# Patient Record
Sex: Male | Born: 1995
Health system: Southern US, Community
[De-identification: ages and names within clinical notes are randomized; demographics above are authoritative.]

## PROBLEM LIST (undated history)

## (undated) DIAGNOSIS — J45909 Unspecified asthma, uncomplicated: Secondary | ICD-10-CM

## (undated) DIAGNOSIS — Z91018 Allergy to other foods: Secondary | ICD-10-CM

## (undated) DIAGNOSIS — Z91010 Allergy to peanuts: Secondary | ICD-10-CM

## (undated) DIAGNOSIS — Z91013 Allergy to seafood: Secondary | ICD-10-CM

## (undated) DIAGNOSIS — J302 Other seasonal allergic rhinitis: Secondary | ICD-10-CM

## (undated) DIAGNOSIS — T7840XA Allergy, unspecified, initial encounter: Secondary | ICD-10-CM

## (undated) DIAGNOSIS — Z91012 Allergy to eggs: Secondary | ICD-10-CM

## (undated) HISTORY — DX: Allergy, unspecified, initial encounter: T78.40XA

---
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
## 2017-04-22 ENCOUNTER — Encounter (HOSPITAL_COMMUNITY): Payer: Self-pay | Admitting: Emergency Medicine

## 2017-04-22 ENCOUNTER — Ambulatory Visit (HOSPITAL_COMMUNITY)
Admission: EM | Admit: 2017-04-22 | Discharge: 2017-04-22 | Disposition: A | Discharge status: Home / Self Care | Payer: Self-pay | Attending: Family Medicine | Admitting: Family Medicine

## 2017-04-22 ENCOUNTER — Ambulatory Visit (HOSPITAL_COMMUNITY): Payer: Self-pay

## 2017-04-22 DIAGNOSIS — M7989 Other specified soft tissue disorders: Secondary | ICD-10-CM | POA: Insufficient documentation

## 2017-04-22 DIAGNOSIS — M79644 Pain in right finger(s): Secondary | ICD-10-CM | POA: Insufficient documentation

## 2017-04-22 DIAGNOSIS — X58XXXA Exposure to other specified factors, initial encounter: Secondary | ICD-10-CM | POA: Insufficient documentation

## 2017-04-22 DIAGNOSIS — Z79899 Other long term (current) drug therapy: Secondary | ICD-10-CM | POA: Insufficient documentation

## 2017-04-22 DIAGNOSIS — S6010XA Contusion of unspecified finger with damage to nail, initial encounter: Secondary | ICD-10-CM | POA: Insufficient documentation

## 2017-04-22 DIAGNOSIS — S60121A Contusion of right index finger with damage to nail, initial encounter: Secondary | ICD-10-CM

## 2017-04-22 DIAGNOSIS — S6991XA Unspecified injury of right wrist, hand and finger(s), initial encounter: Secondary | ICD-10-CM

## 2017-04-22 DIAGNOSIS — R200 Anesthesia of skin: Secondary | ICD-10-CM | POA: Insufficient documentation

## 2017-04-22 DIAGNOSIS — W231XXA Caught, crushed, jammed, or pinched between stationary objects, initial encounter: Secondary | ICD-10-CM

## 2017-04-22 DIAGNOSIS — Z888 Allergy status to other drugs, medicaments and biological substances status: Secondary | ICD-10-CM | POA: Insufficient documentation

## 2017-04-22 DIAGNOSIS — Z880 Allergy status to penicillin: Secondary | ICD-10-CM | POA: Insufficient documentation

## 2017-04-22 DIAGNOSIS — Z91013 Allergy to seafood: Secondary | ICD-10-CM | POA: Insufficient documentation

## 2017-04-22 DIAGNOSIS — J45909 Unspecified asthma, uncomplicated: Secondary | ICD-10-CM | POA: Insufficient documentation

## 2017-04-22 MED ORDER — LIDOCAINE HCL (PF) 2 % IJ SOLN
INTRAMUSCULAR | Status: AC
  Filled 2017-04-22: qty 2

## 2017-04-22 NOTE — ED Notes (Signed)
Pt sent to main hospital via shuttle to radiology.

## 2017-04-22 NOTE — ED Provider Notes (Signed)
CSN: [id number]     Arrival date & time 04/22/17  1315 History   None    Chief Complaint  Patient presents with  . Finger Injury   (Consider location/radiation/quality/duration/timing/severity/associated sxs/prior Treatment) 21 yo male comes in with mother for 1 day history of right index finger pain and swelling after closing his finger on the door. Patient has tried ice and ibuprofen with no relief. He experiences numbness along the finger, and is unable to bend finger. He denies injury to the other fingers.       Past Medical History:  Diagnosis Date  . Asthma   . Egg allergy   . Food allergy   . Food allergy, peanut   . Seasonal allergies   . Shellfish allergy    History reviewed. No pertinent surgical history. History reviewed. No pertinent family history. Social History  Substance Use Topics  . Smoking status: Passive Smoke Exposure - Never Smoker  . Smokeless tobacco: Not on file  . Alcohol use Not on file    Review of Systems  Musculoskeletal: Positive for arthralgias, joint swelling and myalgias.  Skin: Negative for wound.  Neurological: Positive for numbness.    Allergies  Cephalosporins; Eggs or egg-derived products; Peanut-containing drug products; Penicillins; Poultry meal; Shellfish allergy; Mylanta [alum & mag hydroxide-simeth]; and Zithromax [azithromycin]  Home Medications   Prior to Admission medications   Medication Sig Start Date End Date Taking? Authorizing Provider  cetirizine (ZYRTEC) 10 MG tablet Take 10 mg by mouth daily.   Yes [provider]  EPINEPHrine (EPIPEN) 0.3 mg/0.3 mL DEVI Inject 0.3 mLs (0.3 mg total) into the muscle as needed. 03/02/13   Lowanda Foster, NP   Meds Ordered and Administered this Visit  Medications - No data to display  BP 110/66 (BP Location: Left Arm)   Pulse 69   Temp 98.5 F (36.9 C) (Oral)   Resp 16   SpO2 100%  No data found.   Physical Exam  Constitutional: He is oriented to person, place, and  time. He appears well-developed and well-nourished. No distress.  HENT:  Head: Normocephalic and atraumatic.  Eyes: Conjunctivae are normal. Pupils are equal, round, and reactive to light.  Musculoskeletal:  Right index finger swollen from PIP to fingertip both extensor and flexor surface. Subungual hematoma noted. Numbness along whole length of finger. Decrease ROM actively and passively. Tenderness on palpation.   Neurological: He is alert and oriented to person, place, and time.  Skin: Skin is warm and dry. Unable to assess at the right index finger due to pain. Marland Kitchen  Psychiatric: He has a normal mood and affect. His behavior is normal. Judgment normal.    Urgent Care Course     Procedures (including critical care time)  Labs Review Labs Reviewed - No data to display  Imaging Review Dg Finger Index Right  Result Date: 04/22/2017 CLINICAL DATA:  Pt c/o distal right index finger pain after slamming it in a car door last night. Pt denies previous injury. Most painful distal to the DIP joint. Bruising under fingernail. EXAM: RIGHT INDEX FINGER 2+V COMPARISON:  None. FINDINGS: There is no evidence of fracture or dislocation. There is no evidence of arthropathy or other focal bone abnormality. Soft tissues are unremarkable. IMPRESSION: Negative. Electronically Signed   By: Bary Richard M.D.   On: 04/22/2017 14:05       MDM   1. Subungual hematoma of digit of hand, initial encounter   2. Injury of finger of right hand,  initial encounter    1. Reviewed imaging results with patient and mother, no evidence of fracture or dislocation. Subungual hematoma drained. Patient tolerated well and with no immediate complications. Patient to take Tylenol or NSAIDs for pain. Can apply ice to finger for soft tissue swelling.    Belinda FisherYu, Amy V, PA-C 04/22/17 1438

## 2017-04-22 NOTE — ED Triage Notes (Signed)
[facility] a [facility] pain and swelling to the 2nd finger on his right hand secondary to slamming it in a door yesterday.

## 2017-07-16 ENCOUNTER — Encounter (HOSPITAL_COMMUNITY): Payer: Self-pay | Admitting: Emergency Medicine

## 2017-07-16 ENCOUNTER — Ambulatory Visit (HOSPITAL_COMMUNITY)
Admission: EM | Admit: 2017-07-16 | Discharge: 2017-07-16 | Disposition: A | Discharge status: Home / Self Care | Payer: 59 | Attending: Family Medicine | Admitting: Family Medicine

## 2017-07-16 DIAGNOSIS — J45909 Unspecified asthma, uncomplicated: Secondary | ICD-10-CM | POA: Insufficient documentation

## 2017-07-16 DIAGNOSIS — F17200 Nicotine dependence, unspecified, uncomplicated: Secondary | ICD-10-CM | POA: Insufficient documentation

## 2017-07-16 DIAGNOSIS — I889 Nonspecific lymphadenitis, unspecified: Secondary | ICD-10-CM | POA: Diagnosis not present

## 2017-07-16 DIAGNOSIS — Z91013 Allergy to seafood: Secondary | ICD-10-CM | POA: Diagnosis not present

## 2017-07-16 DIAGNOSIS — J029 Acute pharyngitis, unspecified: Secondary | ICD-10-CM

## 2017-07-16 DIAGNOSIS — R51 Headache: Secondary | ICD-10-CM | POA: Insufficient documentation

## 2017-07-16 LAB — POCT RAPID STREP A: Streptococcus, Group A Screen (Direct): NEGATIVE

## 2017-07-16 MED ORDER — CLINDAMYCIN HCL 150 MG PO CAPS: 150.0000 mg | ORAL_CAPSULE | Freq: Three times a day (TID) | ORAL | 0 refills | Status: DC

## 2017-07-16 NOTE — ED Provider Notes (Signed)
[facility]   [id number] 07/16/17 Arrival Time: 1208   SUBJECTIVE:  Matthew Pugh is a 21 y.o. male who presents to the urgent care with complaint of sore throat and headaches for 1 week. Also noted to have fever.  Painful swallowing with lump right neck lateral to voice box  Multiple allergies:  Keflex and pcn included  Has tolerated clindamycin in the past    Past Medical History:  Diagnosis Date  . Asthma   . Egg allergy   . Food allergy   . Food allergy, peanut   . Seasonal allergies   . Shellfish allergy    No family history on file. Social History   Social History  . Marital status: Single    Spouse name: N/A  . Number of children: N/A  . Years of education: N/A   Occupational History  . Not on file.   Social History Main Topics  . Smoking status: Current Some Day Smoker  . Smokeless tobacco: Never Used  . Alcohol use No  . Drug use: Yes    Types: Marijuana  . Sexual activity: Not on file   Other Topics Concern  . Not on file   Social History Narrative  . No narrative on file   No outpatient prescriptions have been marked as taking for the 07/16/17 encounter [facility] Encounter).   Allergies  Allergen Reactions  . Cephalosporins Anaphylaxis    Nose and throat swelling  . Eggs Or Egg-Derived Products Anaphylaxis  . Peanut-Containing Drug Products Anaphylaxis  . Penicillins Hives, Shortness Of Breath, Itching and Rash  . Poultry Meal Anaphylaxis    All poultry  . Shellfish Allergy Anaphylaxis  . Mylanta [Alum & Mag Hydroxide-Simeth] Other (See Comments)    Nose swelling and cough  . Zithromax [Azithromycin] Rash      ROS: As per HPI, remainder of ROS negative.   OBJECTIVE:   Vitals:   07/16/17 1247  BP: 127/78  Pulse: 83  Resp: 16  Temp: 99.6 F (37.6 C)  TempSrc: Oral  SpO2: 100%  Weight: 145 lb (65.8 kg)  Height:  (1.803 m)     General appearance: alert; no distress Eyes: PERRL; EOMI; conjunctiva  normal HENT: normocephalic; atraumatic; TMs normal, canal normal, external ears normal without trauma; nasal mucosa normal; oral mucosa normal Neck: supple, 1 cm right anterior tender lymph node Lungs: clear to auscultation bilaterally Heart: regular rate and rhythm Abdomen: soft, non-tender; bowel sounds normal; no masses or organomegaly; no guarding or rebound tenderness Back: no CVA tenderness Extremities: no cyanosis or edema; symmetrical with no gross deformities Skin: warm and dry Neurologic: normal gait; grossly normal Psychological: alert and cooperative; normal mood and affect      Labs:  Results for orders placed or performed during the hospital encounter of 07/16/17  POCT rapid strep A [facility])  Result Value Ref Range   Streptococcus, Group A Screen (Direct) NEGATIVE NEGATIVE    Labs Reviewed  CULTURE, GROUP A STREP [facility])  POCT RAPID STREP A    No results found.     ASSESSMENT & PLAN:  1. Cervical adenitis   2. Acute pharyngitis, unspecified etiology     Meds ordered this encounter  Medications  . clindamycin (CLEOCIN) 150 MG capsule    Sig: Take 1 capsule (150 mg total) by mouth 3 (three) times daily.    Dispense:  28 capsule    Refill:  0    Reviewed expectations re: course of current medical  issues. Questions answered. Outlined signs and symptoms indicating need for more acute intervention. Patient verbalized understanding. After Visit Summary given.    Procedures:      Elvina Sidle, MD 07/16/17 1327

## 2017-07-16 NOTE — Discharge Instructions (Signed)
The strep test done here in the office is negative. We are therefore running a more comprehensive test that should be available in 2 days.  In the meantime I want you to start taking the antibiotic to cover the swelling in the neck and the fever.

## 2017-07-16 NOTE — ED Triage Notes (Signed)
PT reports sore throat and headaches for 1 week.

## 2017-07-19 LAB — CULTURE, GROUP A STREP (THRC)

## 2017-08-27 ENCOUNTER — Other Ambulatory Visit: Payer: Self-pay

## 2017-08-27 ENCOUNTER — Telehealth: Payer: Self-pay | Admitting: Allergy and Immunology

## 2017-08-27 NOTE — Telephone Encounter (Signed)
Spoke to patient advised we would not be able to send in medication due to him not being seen since March 2016. Advised him to request refill to his nurse tomorrow. Patient verbalizes understanding

## 2017-08-27 NOTE — Telephone Encounter (Signed)
Patient's mom called and said Matthew Pugh has an appt tomorrow 08-28-17 and he will be here, but she wants to know if the medicine, that goes in his nebulizer machine, can be called in today. [facility] on [address].

## 2017-08-28 ENCOUNTER — Encounter: Payer: Self-pay | Admitting: Allergy and Immunology

## 2017-08-28 ENCOUNTER — Ambulatory Visit (INDEPENDENT_AMBULATORY_CARE_PROVIDER_SITE_OTHER): Payer: 59 | Admitting: Allergy and Immunology

## 2017-08-28 VITALS — BP 120/70 | HR 90 | Temp 98.1°F | Resp 18 | Ht 67.72 in | Wt 124.8 lb

## 2017-08-28 DIAGNOSIS — J45909 Unspecified asthma, uncomplicated: Secondary | ICD-10-CM | POA: Insufficient documentation

## 2017-08-28 DIAGNOSIS — J45901 Unspecified asthma with (acute) exacerbation: Secondary | ICD-10-CM | POA: Diagnosis not present

## 2017-08-28 DIAGNOSIS — T7800XD Anaphylactic reaction due to unspecified food, subsequent encounter: Secondary | ICD-10-CM | POA: Diagnosis not present

## 2017-08-28 DIAGNOSIS — J3089 Other allergic rhinitis: Secondary | ICD-10-CM | POA: Diagnosis not present

## 2017-08-28 DIAGNOSIS — T7800XA Anaphylactic reaction due to unspecified food, initial encounter: Secondary | ICD-10-CM | POA: Insufficient documentation

## 2017-08-28 MED ORDER — PREDNISONE 10 MG PO TABS: ORAL_TABLET | ORAL | 0 refills | Status: DC

## 2017-08-28 MED ORDER — EPINEPHRINE 0.3 MG/0.3ML IJ SOAJ: INTRAMUSCULAR | 3 refills | Status: DC

## 2017-08-28 MED ORDER — ALBUTEROL SULFATE HFA 108 (90 BASE) MCG/ACT IN AERS: 1.0000 | INHALATION_SPRAY | Freq: Four times a day (QID) | RESPIRATORY_TRACT | 2 refills | Status: DC | PRN

## 2017-08-28 MED ORDER — ALBUTEROL SULFATE (2.5 MG/3ML) 0.083% IN NEBU: 2.5000 mg | INHALATION_SOLUTION | Freq: Four times a day (QID) | RESPIRATORY_TRACT | 3 refills | Status: DC | PRN

## 2017-08-28 MED ORDER — FLUTICASONE PROPIONATE 50 MCG/ACT NA SUSP: 2.0000 | Freq: Every day | NASAL | 5 refills | Status: DC

## 2017-08-28 MED ORDER — FLUTICASONE PROPIONATE HFA 110 MCG/ACT IN AERO: 2.0000 | INHALATION_SPRAY | Freq: Two times a day (BID) | RESPIRATORY_TRACT | 5 refills | Status: DC

## 2017-08-28 NOTE — Progress Notes (Signed)
Follow-up Note  RE: Matthew Pugh MRN: [id number] DOB: 27-Nov-1995 Date of Office Visit: 08/28/2017  Primary care provider: Patient, No Pcp Per Referring provider: No ref. provider found  History of present illness: Matthew Pugh is a 21 y.o. male with persistent asthma, allergic rhinitis, and food allergies presenting today for a sick visit.  He was last seen in this clinic in March 2016 by Dr. Willa Rough, who has since left the practice.  He is currently not on an asthma controller medication and reports that he had been experiencing asthma symptoms a few days per week on average until 2 days ago when his asthma symptoms progressed significantly.  Over the past 48 hours he has been experiencing frequent coughing, dyspnea, chest tightness, and wheezing throughout the day and has been awakened from sleep multiple times over the past 2 nights because of his lower respiratory symptoms.  He states that he has also been experiencing nasal congestion and possibly maxillary sinus congestion over the past 2 days as well.  He denies fevers, chills, or discolored mucus production.  He attempts to control his asthma symptoms with albuterol HFA and his nasal allergy symptoms with cetirizine. Matthew Pugh avoids chicken, [place], fish, eggs, peanuts, and tree nuts.   Assessment and plan: Asthma with acute exacerbation  Prednisone has been provided, 40 mg x3 days, 20 mg x1 day, 10 mg x1 day, then stop.  A prescription has been provided for Flovent (fluticasone) 110 g,  2 inhalations twice a day. To maximize pulmonary deposition, a spacer has been provided along with instructions for its proper administration with an HFA inhaler.  A refill prescription has been provided for albuterol HFA, 1-2 inhalations every 4-6 hours as needed.  The patient has been asked to contact me if his symptoms persist or progress. Otherwise, he may return for follow up in 3 months.  Other allergic rhinitis  Continue appropriate  allergen avoidance measures.  A prescription has been provided for fluticasone nasal spray, 2 sprays per nostril daily as needed. Proper nasal spray technique has been discussed and demonstrated.  I have also recommended nasal saline spray (i.e., Simply Saline) or nasal saline lavage (i.e., NeilMed) as needed and prior to medicated nasal sprays.  Continue cetirizine 10 mg daily if needed.  If allergen avoidance measures and medications fail to adequately relieve symptoms, aeroallergen immunotherapy will be considered.  Food allergy  Continue careful avoidance of chicken, [place], fish, eggs, peanuts, and tree nuts and have access to epinephrine autoinjector 2 pack in case of accidental ingestion.  A food allergy action plan has been provided and discussed.   A refill prescription has been provided for epinephrine 0.3 mg autoinjector 2 pack along with instructions for its proper administration.   Meds ordered this encounter  Medications  . albuterol (VENTOLIN HFA) 108 (90 Base) MCG/ACT inhaler    Sig: Inhale 1-2 puffs into the lungs every 6 (six) hours as needed for wheezing or shortness of breath.    Dispense:  1 Inhaler    Refill:  2  . albuterol (PROVENTIL) (2.5 MG/3ML) 0.083% nebulizer solution    Sig: Take 3 mLs (2.5 mg total) by nebulization every 6 (six) hours as needed for wheezing or shortness of breath.    Dispense:  75 mL    Refill:  3  . EPINEPHrine (AUVI-Q) 0.3 mg/0.3 mL IJ SOAJ injection    Sig: Use as directed for severe allergic reactions    Dispense:  4 Device    Refill:  3  . fluticasone (FLOVENT HFA) 110 MCG/ACT inhaler    Sig: Inhale 2 puffs into the lungs 2 (two) times daily. Rinse, gargle and spit out after use    Dispense:  1 Inhaler    Refill:  5  . fluticasone (FLONASE) 50 MCG/ACT nasal spray    Sig: Place 2 sprays into both nostrils daily. As needed    Dispense:  16 g    Refill:  5  . predniSONE (DELTASONE) 10 MG tablet    Sig: 40 mg for 3 days, 20 mg  for 1 day, and 10 mg for one day, then stop    Dispense:  15 tablet    Refill:  0    Diagnostics: Spirometry reveals an FVC of 2.99 L 67% predicted) and an FEV1 of 1.83 L (48% predicted) with significant (200 mL) postbronchodilator improvement.  Please see scanned spirometry results for details.    Physical examination: Blood pressure 120/70, pulse 90, temperature 98.1 F (36.7 C), temperature source Oral, resp. rate 18, height 5' 7.72" (1.72 m), weight 124 lb 12.5 oz (56.6 kg), SpO2 97 %.  General: Alert, interactive, in no acute distress. HEENT: TMs pearly gray, turbinates edematous with thick discharge, post-pharynx erythematous. Neck: Supple without lymphadenopathy. Lungs: Mildly decreased breath sounds with expiratory wheezing bilaterally. CV: Normal S1, S2 without murmurs. Skin: Warm and dry, without lesions or rashes.  The following portions of the patient's history were reviewed and updated as appropriate: allergies, current medications, past family history, past medical history, past social history, past surgical history and problem list.  Allergies as of 08/28/2017      Reactions   Cephalosporins Anaphylaxis   Nose and throat swelling   Eggs Or Egg-derived Products Anaphylaxis   Peanut-containing Drug Products Anaphylaxis   Penicillins Hives, Shortness Of Breath, Itching, Rash   Poultry Meal Anaphylaxis   All poultry   Shellfish Allergy Anaphylaxis   Mylanta [alum & Mag Hydroxide-simeth] Other (See Comments)   Nose swelling and cough   Zithromax [azithromycin] Rash      Medication List       Accurate as of 08/28/17  7:09 PM. Always use your most recent med list.          albuterol 108 (90 Base) MCG/ACT inhaler Commonly known as:  VENTOLIN HFA Inhale 1-2 puffs into the lungs every 6 (six) hours as needed for wheezing or shortness of breath.   albuterol (2.5 MG/3ML) 0.083% nebulizer solution Commonly known as:  PROVENTIL Take 3 mLs (2.5 mg total) by  nebulization every 6 (six) hours as needed for wheezing or shortness of breath.   cetirizine 10 MG tablet Commonly known as:  ZYRTEC Take 10 mg by mouth daily.   clindamycin 150 MG capsule Commonly known as:  CLEOCIN Take 1 capsule (150 mg total) by mouth 3 (three) times daily.   EPINEPHrine 0.3 mg/0.3 mL Soaj injection Commonly known as:  EPIPEN Inject 0.3 mLs (0.3 mg total) into the muscle as needed.   EPINEPHrine 0.3 mg/0.3 mL Soaj injection Commonly known as:  AUVI-Q Use as directed for severe allergic reactions   fluticasone 110 MCG/ACT inhaler Commonly known as:  FLOVENT HFA Inhale 2 puffs into the lungs 2 (two) times daily. Rinse, gargle and spit out after use   fluticasone 50 MCG/ACT nasal spray Commonly known as:  FLONASE Place 2 sprays into both nostrils daily. As needed   predniSONE 10 MG tablet Commonly known as:  DELTASONE 40 mg for 3 days, 20 mg for  1 day, and 10 mg for one day, then stop       Allergies  Allergen Reactions  . Cephalosporins Anaphylaxis    Nose and throat swelling  . Eggs Or Egg-Derived Products Anaphylaxis  . Peanut-Containing Drug Products Anaphylaxis  . Penicillins Hives, Shortness Of Breath, Itching and Rash  . Poultry Meal Anaphylaxis    All poultry  . Shellfish Allergy Anaphylaxis  . Mylanta [Alum & Mag Hydroxide-Simeth] Other (See Comments)    Nose swelling and cough  . Zithromax [Azithromycin] Rash   Review of systems: Review of systems negative except as noted in HPI / PMHx or noted below: Constitutional: Negative.  HENT: Negative.   Eyes: Negative.  Respiratory: Negative.   Cardiovascular: Negative.  Gastrointestinal: Negative.  Genitourinary: Negative.  Musculoskeletal: Negative.  Neurological: Negative.  Endo/Heme/Allergies: Negative.  Cutaneous: Negative.  Past Medical History:  Diagnosis Date  . Asthma   . Egg allergy   . Food allergy   . Food allergy, peanut   . Seasonal allergies   . Shellfish allergy       History reviewed. No pertinent family history.  Social History   Social History  . Marital status: Single    Spouse name: N/A  . Number of children: N/A  . Years of education: N/A   Occupational History  . Not on file.   Social History Main Topics  . Smoking status: Current Some Day Smoker  . Smokeless tobacco: Never Used  . Alcohol use No  . Drug use: Yes    Types: Marijuana  . Sexual activity: Not on file   Other Topics Concern  . Not on file   Social History Narrative  . No narrative on file    I appreciate the opportunity to take part in Matthew Pugh's care. Please do not hesitate to contact me with questions.  Sincerely,   R. Jorene Guestarter Ciera Beckum, MD

## 2017-08-28 NOTE — Assessment & Plan Note (Addendum)
   Prednisone has been provided, 40 mg x3 days, 20 mg x1 day, 10 mg x1 day, then stop.  A prescription has been provided for Flovent (fluticasone) 110 g, 2 inhalations twice a day. To maximize pulmonary deposition, a spacer has been provided along with instructions for its proper administration with an HFA inhaler.  A refill prescription has been provided for albuterol HFA, 1-2 inhalations every 4-6 hours as needed.  The patient has been asked to contact me if his symptoms persist or progress. Otherwise, he may return for follow up in 3 months.

## 2017-08-28 NOTE — Patient Instructions (Addendum)
Asthma with acute exacerbation  Prednisone has been provided, 40 mg x3 days, 20 mg x1 day, 10 mg x1 day, then stop.  A prescription has been provided for Flovent (fluticasone) 110 g,  2 inhalations twice a day. To maximize pulmonary deposition, a spacer has been provided along with instructions for its proper administration with an HFA inhaler.  A refill prescription has been provided for albuterol HFA, 1-2 inhalations every 4-6 hours as needed.  The patient has been asked to contact me if his symptoms persist or progress. Otherwise, he may return for follow up in 3 months.  Other allergic rhinitis  Continue appropriate allergen avoidance measures.  A prescription has been provided for fluticasone nasal spray, 2 sprays per nostril daily as needed. Proper nasal spray technique has been discussed and demonstrated.  I have also recommended nasal saline spray (i.e., Simply Saline) or nasal saline lavage (i.e., NeilMed) as needed and prior to medicated nasal sprays.  Continue cetirizine 10 mg daily if needed.  If allergen avoidance measures and medications fail to adequately relieve symptoms, aeroallergen immunotherapy will be considered.  Food allergy  Continue careful avoidance of chicken, Malaw[place]urkey, fish, eggs, peanuts, and tree nuts and have access to epinephrine autoinjector 2 pack in case of accidental ingestion.  A food allergy action plan has been provided and discussed.   A refill prescription has been provided for epinephrine 0.3 mg autoinjector 2 pack along with instructions for its proper administration.   Return in about 3 months (around 11/28/2017), or if symptoms worsen or fail to improve.

## 2017-08-28 NOTE — Assessment & Plan Note (Signed)
   Continue careful avoidance of chicken, Malawiturkey, fish, eggs, peanuts, and tree nuts and have access to epinephrine autoinjector 2 pack in case of accidental ingestion.  A food allergy action plan has been provided and discussed.   A refill prescription has been provided for epinephrine 0.3 mg autoinjector 2 pack along with instructions for its proper administration.

## 2017-08-28 NOTE — Assessment & Plan Note (Signed)
   Continue appropriate allergen avoidance measures.  A prescription has been provided for fluticasone nasal spray, 2 sprays per nostril daily as needed. Proper nasal spray technique has been discussed and demonstrated.  I have also recommended nasal saline spray (i.e., Simply Saline) or nasal saline lavage (i.e., NeilMed) as needed and prior to medicated nasal sprays.  Continue cetirizine 10 mg daily if needed.  If allergen avoidance measures and medications fail to adequately relieve symptoms, aeroallergen immunotherapy will be considered.

## 2017-08-29 MED ORDER — EPINEPHRINE 0.3 MG/0.3ML IJ SOAJ: INTRAMUSCULAR | 3 refills | Status: DC

## 2017-08-29 NOTE — Addendum Note (Signed)
Addended by: Virl SonGAINEY, Goerge Mohr D on: 08/29/2017 08:39 AM   Modules accepted: Orders

## 2017-10-24 ENCOUNTER — Telehealth: Payer: Self-pay | Admitting: General Practice

## 2017-10-24 NOTE — Telephone Encounter (Signed)
Copied from CRM [id number]. Topic: Appointment Scheduling - Scheduling Inquiry for Clinic >> Oct 24, 2017  9:35 AM Arlyss Gandyichardson, Taren N, NT wrote: Reason for CRM: Marland Kitchenonya Swigart who is this pts mom and a pt of Dr. Okey Duprerawford is wanting to see if Dr. Okey Duprerawford will accept this pt as a new pt? Per OneNote Dr. Okey Duprerawford needs to approve pt before scheduling for a new pt appt.

## 2017-10-25 NOTE — Telephone Encounter (Signed)
I have set up an appointment for 11/02/17 and mailed patient new patient paperwork.

## 2017-10-25 NOTE — Telephone Encounter (Signed)
Fine, no more than 1 new patient per day 

## 2017-11-02 ENCOUNTER — Encounter: Payer: Self-pay | Admitting: Internal Medicine

## 2017-11-02 ENCOUNTER — Ambulatory Visit (INDEPENDENT_AMBULATORY_CARE_PROVIDER_SITE_OTHER): Payer: 59 | Admitting: Internal Medicine

## 2017-11-02 VITALS — BP 100/70 | HR 72 | Temp 98.2°F | Ht 67.72 in | Wt 131.0 lb

## 2017-11-02 DIAGNOSIS — J4520 Mild intermittent asthma, uncomplicated: Secondary | ICD-10-CM

## 2017-11-02 DIAGNOSIS — T7800XD Anaphylactic reaction due to unspecified food, subsequent encounter: Secondary | ICD-10-CM | POA: Diagnosis not present

## 2017-11-02 DIAGNOSIS — Z0000 Encounter for general adult medical examination without abnormal findings: Secondary | ICD-10-CM | POA: Insufficient documentation

## 2017-11-02 NOTE — Assessment & Plan Note (Signed)
Declines flu shot and HIV screening. Thinks he got a tetanus in high school and likely not due for another 5-6 years. Counseled about the need to stop smoking due to his asthma. Counseled about safe sexual practices and how to prevent STDs. Given screening recommendations.

## 2017-11-02 NOTE — Progress Notes (Signed)
   Subjective:    Patient ID: Matthew Pugh, male    DOB: September 22, 1996, 22 y.o.   MRN: [id number]  HPI The patient is a new 22 YO man coming in for wellness. He does have food allergies and asthma but they are doing well today. He sees asthma specialist. He is limited with foods due to egg, poultry and peanut allergy. Does smoke marijuana but not cigarettes. Daily for some low back pain or personal usage.  [facility], social history reviewed and updated.   Review of Systems  Constitutional: Negative.   HENT: Negative.   Eyes: Negative.   Respiratory: Negative for cough, chest tightness and shortness of breath.   Cardiovascular: Negative for chest pain, palpitations and leg swelling.  Gastrointestinal: Negative for abdominal distention, abdominal pain, constipation, diarrhea, nausea and vomiting.  Musculoskeletal: Negative.   Skin: Negative.   Neurological: Negative.   Psychiatric/Behavioral: Negative.       Objective:   Physical Exam  Constitutional: He is oriented to person, place, and time. He appears well-developed and well-nourished.  HENT:  Head: Normocephalic and atraumatic.  Eyes: EOM are normal.  Neck: Normal range of motion.  Cardiovascular: Normal rate and regular rhythm.  Pulmonary/Chest: Effort normal and breath sounds normal. No respiratory distress. He has no wheezes. He has no rales.  Abdominal: Soft. Bowel sounds are normal. He exhibits no distension. There is no tenderness. There is no rebound.  Musculoskeletal: He exhibits no edema.  Neurological: He is alert and oriented to person, place, and time. Coordination normal.  Skin: Skin is warm and dry.  Psychiatric: He has a normal mood and affect.   Vitals:   11/02/17 1025  BP: 100/70  Pulse: 72  Temp: 98.2 F (36.8 C)  TempSrc: Oral  SpO2: 100%  Weight: 131 lb (59.4 kg)  Height: 5' 7.72" (1.72 m)      Assessment & Plan:

## 2017-11-02 NOTE — Patient Instructions (Signed)

## 2017-11-02 NOTE — Assessment & Plan Note (Signed)
He has allergy to peanut, egg, and poultry and shellfish allergy. He avoids these in his diet and has epipen at home and can use if needed.

## 2017-11-02 NOTE — Assessment & Plan Note (Signed)
Done with prednisone and doing well with albuterol prn. He uses flovent prn for a week or so when he is having symptoms. Removed inactive medications from his list. He has nebulizer and inhaler albuterol at home to use as needed. Advised that smoking marijuana is not good for his asthma and that he should get flu shot yearly and he declines flu shot today. Reassurance given that there is an egg free flu shot given his allergy.

## 2017-12-03 ENCOUNTER — Ambulatory Visit (INDEPENDENT_AMBULATORY_CARE_PROVIDER_SITE_OTHER): Payer: 59

## 2017-12-03 ENCOUNTER — Ambulatory Visit: Payer: 59 | Admitting: Physician Assistant

## 2017-12-03 ENCOUNTER — Encounter: Payer: Self-pay | Admitting: Physician Assistant

## 2017-12-03 VITALS — BP 118/62 | HR 86 | Temp 99.2°F | Resp 16 | Ht 67.0 in | Wt 128.0 lb

## 2017-12-03 DIAGNOSIS — R05 Cough: Secondary | ICD-10-CM

## 2017-12-03 DIAGNOSIS — R059 Cough, unspecified: Secondary | ICD-10-CM

## 2017-12-03 DIAGNOSIS — M546 Pain in thoracic spine: Secondary | ICD-10-CM

## 2017-12-03 DIAGNOSIS — J4530 Mild persistent asthma, uncomplicated: Secondary | ICD-10-CM

## 2017-12-03 NOTE — Progress Notes (Signed)
[facility] [address], [place] [phone]  Date:  12/03/2017   Name:  Matthew OchJustin T Pugh   DOB:  Apr 18, 1996   MRN:  [id number]  PCP:  Myrlene Brokerrawford, Elizabeth A, MD    History of Present Illness:  Matthew Pugh is a 22 y.o. male patient who presents to PCP with  Chief Complaint  Patient presents with  . Cough    x 2 wks, productive  . Back Pain    pt has been dealing with back pain for a while    Mother is with patient. 2 weeks of back pain but has not been very bad, but started to progressively worsen in his lower back.  The pain hurts on his own.  Bending down or stretching worsens the pain.  Both sides of middle radiates up back.   Working at a [employer], and cooking.  He is not exercising.  He is not playing sports.  No new bed or mattress or positional changes.  He was not doing anything when the pain started.  6-7/10.  The pain never resolves.  No hx of back pain.  No hx of injuring his back.   No dysuria, no urinating more than he normal, or hematuria.   He has no sob or dyspnea.  He is coughing.  Reports coughing for 2 months.  He has hx of asthma.  He does not use his Flovent.  He will use his albuterol as needed which has increased though he can not give an exact number.  No fever.     Patient Active Problem List   Diagnosis Date Noted  . Routine general medical examination at a health care facility 11/02/2017  . Asthma 08/28/2017  . Other allergic rhinitis 08/28/2017  . Food allergy 08/28/2017    Past Medical History:  Diagnosis Date  . Allergy   . Asthma   . Egg allergy   . Food allergy   . Food allergy, peanut   . Seasonal allergies   . Shellfish allergy     No past surgical history on file.  Social History   Tobacco Use  . Smoking status: Never Smoker  . Smokeless tobacco: Never Used  Substance Use Topics  . Alcohol use: No  . Drug use: Yes    Types: Marijuana    Family History  Problem Relation Age of Onset   . Asthma Sister   . Kidney disease Maternal Grandfather   . Kidney disease Paternal Grandmother   . Stroke Paternal Grandmother   . Hypertension Paternal Grandfather   . Stroke Paternal Grandfather     Allergies  Allergen Reactions  . Cephalosporins Anaphylaxis    Nose and throat swelling  . Eggs Or Egg-Derived Products Anaphylaxis  . Peanut-Containing Drug Products Anaphylaxis  . Penicillins Hives, Shortness Of Breath, Itching and Rash  . Poultry Meal Anaphylaxis    All poultry  . Shellfish Allergy Anaphylaxis  . Mylanta [Alum & Mag Hydroxide-Simeth] Other (See Comments)    Nose swelling and cough  . Zithromax [Azithromycin] Rash    Medication list has been reviewed and updated.  Current Outpatient Medications on File Prior to Visit  Medication Sig Dispense Refill  . albuterol (PROVENTIL) (2.5 MG/3ML) 0.083% nebulizer solution Take 3 mLs (2.5 mg total) by nebulization every 6 (six) hours as needed for wheezing or shortness of breath. 75 mL 3  . EPINEPHrine (AUVI-Q) 0.3 mg/0.3 mL IJ SOAJ injection Use as directed for severe allergic  reactions 4 Device 3  . EPINEPHrine (EPIPEN) 0.3 mg/0.3 mL DEVI Inject 0.3 mLs (0.3 mg total) into the muscle as needed. 2 Device 1  . albuterol (VENTOLIN HFA) 108 (90 Base) MCG/ACT inhaler Inhale 1-2 puffs into the lungs every 6 (six) hours as needed for wheezing or shortness of breath. (Patient not taking: Reported on 12/03/2017) 1 Inhaler 2  . cetirizine (ZYRTEC) 10 MG tablet Take 10 mg by mouth daily.    . fluticasone (FLONASE) 50 MCG/ACT nasal spray Place 2 sprays into both nostrils daily. As needed (Patient not taking: Reported on 12/03/2017) 16 g 5  . fluticasone (FLOVENT HFA) 110 MCG/ACT inhaler Inhale 2 puffs into the lungs 2 (two) times daily. Rinse, gargle and spit out after use (Patient not taking: Reported on 12/03/2017) 1 Inhaler 5   No current facility-administered medications on file prior to visit.     ROS ROS otherwise unremarkable  unless listed above.  Physical Examination: BP 118/62   Pulse 86   Temp 99.2 F (37.3 C) (Oral)   Resp 16   Ht 5\' 7"  (1.702 m)   Wt 128 lb (58.1 kg)   SpO2 98%   BMI 20.05 kg/m  Ideal Body Weight: Weight in (lb) to have BMI = 25: 159.3  Physical Exam  Constitutional: He is oriented to person, place, and time. He appears well-developed and well-nourished. No distress.  HENT:  Head: Normocephalic and atraumatic.  Eyes: Conjunctivae and EOM are normal. Pupils are equal, round, and reactive to light.  Cardiovascular: Normal rate, regular rhythm, normal heart sounds and intact distal pulses. Exam reveals no friction rub.  No murmur heard. Pulmonary/Chest: Effort normal. No respiratory distress.  Musculoskeletal:       Thoracic back: He exhibits tenderness (bilateral tenderness along the adjacent musculature of the mid thoracic vertebrae.  no crepitus or palpable muscle spasm). He exhibits normal range of motion, no swelling and no spasm.  Neurological: He is alert and oriented to person, place, and time.  Skin: Skin is warm and dry. He is not diaphoretic.  Psychiatric: He has a normal mood and affect. His behavior is normal.    Dg Chest 2 View  Result Date: 12/03/2017 CLINICAL DATA:  Coughing.  History of asthma. EXAM: CHEST  2 VIEW COMPARISON:  July 20, 2014 FINDINGS: The heart size and mediastinal contours are within normal limits. Both lungs are clear. The visualized skeletal structures are unremarkable. IMPRESSION: No active cardiopulmonary disease. Electronically Signed   By: Gerome Sam III M.D   On: 12/03/2017 18:51   Assessment and Plan: Matthew Pugh is a 22 y.o. male who is here today for cc of  Chief Complaint  Patient presents with  . Cough    x 2 wks, productive  . Back Pain    pt has been dealing with back pain for a while   Mother and patient are requesting a chest XR.  This is normal.  Discussed the harm and prolonged damage of untreated asthma.  Urged  to take preventative medications.   Discussed core strengthening and appropriate posture.  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  Discussed stretches and strengthening exercises. F/u in 2 weeks if sxs fail to improve.   Mild persistent asthma without complication  Acute bilateral thoracic back pain  Cough - Plan: DG Chest 2 View  Trena Platt, PA-C [facility] 2/12/20198:12 AM

## 2017-12-03 NOTE — Patient Instructions (Addendum)
Your chest xray looks normal at this time.  You must use your flovent.   I would like you to perform three stretches three times per day for 15 minutes.  Ice directly after. It is also important that you strengthen the core.   You can use ibuprofen 600mg  every 8 hours as needed for pain.   Make sure you obtain new sneakers with good shock absorbency.   Do not use consistently longer than 1 week.   Back Exercises If you have pain in your back, do these exercises 2-3 times each day or as told by your doctor. When the pain goes away, do the exercises once each day, but repeat the steps more times for each exercise (do more repetitions). If you do not have pain in your back, do these exercises once each day or as told by your doctor. Exercises Single Knee to Chest  Do these steps 3-5 times in a row for each leg: 1. Lie on your back on a firm bed or the floor with your legs stretched out. 2. Bring one knee to your chest. 3. Hold your knee to your chest by grabbing your knee or thigh. 4. Pull on your knee until you feel a gentle stretch in your lower back. 5. Keep doing the stretch for 10-30 seconds. 6. Slowly let go of your leg and straighten it.  Pelvic Tilt  Do these steps 5-10 times in a row: 1. Lie on your back on a firm bed or the floor with your legs stretched out. 2. Bend your knees so they point up to the ceiling. Your feet should be flat on the floor. 3. Tighten your lower belly (abdomen) muscles to press your lower back against the floor. This will make your tailbone point up to the ceiling instead of pointing down to your feet or the floor. 4. Stay in this position for 5-10 seconds while you gently tighten your muscles and breathe evenly.  Cat-Cow  Do these steps until your lower back bends more easily: 1. Get on your hands and knees on a firm surface. Keep your hands under your shoulders, and keep your knees under your hips. You may put padding under your knees. 2. Let your  head hang down, and make your tailbone point down to the floor so your lower back is round like the back of a cat. 3. Stay in this position for 5 seconds. 4. Slowly lift your head and make your tailbone point up to the ceiling so your back hangs low (sags) like the back of a cow. 5. Stay in this position for 5 seconds.  Press-Ups  Do these steps 5-10 times in a row: 1. Lie on your belly (face-down) on the floor. 2. Place your hands near your head, about shoulder-width apart. 3. While you keep your back relaxed and keep your hips on the floor, slowly straighten your arms to raise the top half of your body and lift your shoulders. Do not use your back muscles. To make yourself more comfortable, you may change where you place your hands. 4. Stay in this position for 5 seconds. 5. Slowly return to lying flat on the floor.  Bridges  Do these steps 10 times in a row: 1. Lie on your back on a firm surface. 2. Bend your knees so they point up to the ceiling. Your feet should be flat on the floor. 3. Tighten your butt muscles and lift your butt off of the floor until your waist  is almost as high as your knees. If you do not feel the muscles working in your butt and the back of your thighs, slide your feet 1-2 inches farther away from your butt. 4. Stay in this position for 3-5 seconds. 5. Slowly lower your butt to the floor, and let your butt muscles relax.  If this exercise is too easy, try doing it with your arms crossed over your chest. Belly Crunches  Do these steps 5-10 times in a row: 1. Lie on your back on a firm bed or the floor with your legs stretched out. 2. Bend your knees so they point up to the ceiling. Your feet should be flat on the floor. 3. Cross your arms over your chest. 4. Tip your chin a little bit toward your chest but do not bend your neck. 5. Tighten your belly muscles and slowly raise your chest just enough to lift your shoulder blades a tiny bit off of the  floor. 6. Slowly lower your chest and your head to the floor.  Back Lifts Do these steps 5-10 times in a row: 1. Lie on your belly (face-down) with your arms at your sides, and rest your forehead on the floor. 2. Tighten the muscles in your legs and your butt. 3. Slowly lift your chest off of the floor while you keep your hips on the floor. Keep the back of your head in line with the curve in your back. Look at the floor while you do this. 4. Stay in this position for 3-5 seconds. 5. Slowly lower your chest and your face to the floor.  Contact a doctor if:  Your back pain gets a lot worse when you do an exercise.  Your back pain does not lessen 2 hours after you exercise. If you have any of these problems, stop doing the exercises. Do not do them again unless your doctor says it is okay. Get help right away if:  You have sudden, very bad back pain. If this happens, stop doing the exercises. Do not do them again unless your doctor says it is okay. This information is not intended to replace advice given to you by your health care provider. Make sure you discuss any questions you have with your health care provider. Document Released: 11/18/2010 Document Revised: 03/23/2016 Document Reviewed: 12/10/2014 Elsevier Interactive Patient Education  2018 [employer]     IF you received an x-ray today, you will receive an invoice from [facility]. Please contact [facility] at [phone] with questions or concerns regarding your invoice.   IF you received labwork today, you will receive an invoice from [facility]. Please contact [facility] at [phone] with questions or concerns regarding your invoice.   Our billing staff will not be able to assist you with questions regarding bills from these companies.  You will be contacted with the lab results as soon as they are available. The fastest way to get your results is to activate your My Chart account. Instructions are  located on the last page of this paperwork. If you have not heard from us regarding the results in 2 weeks, please contact this office.

## 2018-01-28 ENCOUNTER — Encounter: Payer: Self-pay | Admitting: Physician Assistant

## 2018-04-18 ENCOUNTER — Emergency Department (HOSPITAL_COMMUNITY): Payer: 59

## 2018-04-18 ENCOUNTER — Encounter (HOSPITAL_COMMUNITY): Payer: Self-pay | Admitting: Emergency Medicine

## 2018-04-18 ENCOUNTER — Emergency Department (HOSPITAL_COMMUNITY)
Admission: EM | Admit: 2018-04-18 | Discharge: 2018-04-18 | Disposition: A | Discharge status: Home / Self Care | Payer: 59 | Attending: Emergency Medicine | Admitting: Emergency Medicine

## 2018-04-18 DIAGNOSIS — M545 Low back pain, unspecified: Secondary | ICD-10-CM

## 2018-04-18 DIAGNOSIS — R519 Headache, unspecified: Secondary | ICD-10-CM

## 2018-04-18 DIAGNOSIS — Z91010 Allergy to peanuts: Secondary | ICD-10-CM | POA: Diagnosis not present

## 2018-04-18 DIAGNOSIS — Z79899 Other long term (current) drug therapy: Secondary | ICD-10-CM | POA: Diagnosis not present

## 2018-04-18 DIAGNOSIS — R509 Fever, unspecified: Secondary | ICD-10-CM | POA: Insufficient documentation

## 2018-04-18 DIAGNOSIS — J45909 Unspecified asthma, uncomplicated: Secondary | ICD-10-CM | POA: Insufficient documentation

## 2018-04-18 DIAGNOSIS — R51 Headache: Secondary | ICD-10-CM | POA: Diagnosis not present

## 2018-04-18 LAB — URINALYSIS, ROUTINE W REFLEX MICROSCOPIC
BILIRUBIN URINE: NEGATIVE
GLUCOSE, UA: NEGATIVE mg/dL
Hgb urine dipstick: NEGATIVE
Ketones, ur: 5 mg/dL — AB
LEUKOCYTES UA: NEGATIVE
NITRITE: NEGATIVE
PH: 5 (ref 5.0–8.0)
PROTEIN: NEGATIVE mg/dL
Specific Gravity, Urine: 1.01 (ref 1.005–1.030)

## 2018-04-18 LAB — CBC WITH DIFFERENTIAL/PLATELET
BASOS ABS: 0 10*3/uL (ref 0.0–0.1)
BASOS PCT: 0 %
EOS PCT: 0 %
Eosinophils Absolute: 0 10*3/uL (ref 0.0–0.7)
HCT: 46 % (ref 39.0–52.0)
Hemoglobin: 16.1 g/dL (ref 13.0–17.0)
Lymphocytes Relative: 18 %
Lymphs Abs: 0.9 10*3/uL (ref 0.7–4.0)
MCH: 31.4 pg (ref 26.0–34.0)
MCHC: 35 g/dL (ref 30.0–36.0)
MCV: 89.7 fL (ref 78.0–100.0)
MONO ABS: 0.2 10*3/uL (ref 0.1–1.0)
MONOS PCT: 5 %
Neutro Abs: 3.8 10*3/uL (ref 1.7–7.7)
Neutrophils Relative %: 77 %
PLATELETS: 225 10*3/uL (ref 150–400)
RBC: 5.13 MIL/uL (ref 4.22–5.81)
RDW: 12.2 % (ref 11.5–15.5)
WBC: 5 10*3/uL (ref 4.0–10.5)

## 2018-04-18 LAB — RAPID URINE DRUG SCREEN, HOSP PERFORMED
Amphetamines: NOT DETECTED
Benzodiazepines: NOT DETECTED
Cocaine: NOT DETECTED
Opiates: NOT DETECTED
Tetrahydrocannabinol: POSITIVE — AB

## 2018-04-18 LAB — GROUP A STREP BY PCR: GROUP A STREP BY PCR: NOT DETECTED

## 2018-04-18 LAB — COMPREHENSIVE METABOLIC PANEL
ALBUMIN: 4.6 g/dL (ref 3.5–5.0)
ALK PHOS: 57 U/L (ref 38–126)
ALT: 27 U/L (ref 17–63)
AST: 29 U/L (ref 15–41)
Anion gap: 7 (ref 5–15)
BILIRUBIN TOTAL: 1 mg/dL (ref 0.3–1.2)
BUN: 13 mg/dL (ref 6–20)
CALCIUM: 9.3 mg/dL (ref 8.9–10.3)
CO2: 26 mmol/L (ref 22–32)
Chloride: 104 mmol/L (ref 101–111)
Creatinine, Ser: 1.29 mg/dL — ABNORMAL HIGH (ref 0.61–1.24)
GFR calc Af Amer: >60 mL/min (ref >60)
GFR calc non Af Amer: >60 mL/min (ref >60)
GLUCOSE: 101 mg/dL — AB (ref 65–99)
Potassium: 3.7 mmol/L (ref 3.5–5.1)
Sodium: 137 mmol/L (ref 135–145)
TOTAL PROTEIN: 7.9 g/dL (ref 6.5–8.1)

## 2018-04-18 LAB — I-STAT CG4 LACTIC ACID, ED: Lactic Acid, Venous: 0.81 mmol/L (ref 0.5–1.9)

## 2018-04-18 MED ORDER — SODIUM CHLORIDE 0.9 % IV SOLN: INTRAVENOUS | Status: DC

## 2018-04-18 MED ORDER — SODIUM CHLORIDE 0.9 % IV BOLUS (SEPSIS)
1000.0000 mL | Freq: Once | INTRAVENOUS | Status: AC
  Administered 2018-04-18: 1000 mL via INTRAVENOUS

## 2018-04-18 MED ORDER — ACETAMINOPHEN 500 MG PO TABS: 1000.0000 mg | ORAL_TABLET | Freq: Four times a day (QID) | ORAL | 0 refills | Status: DC | PRN

## 2018-04-18 MED ORDER — IBUPROFEN 800 MG PO TABS: 800.0000 mg | ORAL_TABLET | Freq: Three times a day (TID) | ORAL | 0 refills | Status: DC

## 2018-04-18 MED ORDER — IBUPROFEN 200 MG PO TABS
400.0000 mg | ORAL_TABLET | Freq: Once | ORAL | Status: AC
  Administered 2018-04-18: 400 mg via ORAL
  Filled 2018-04-18: qty 2

## 2018-04-18 MED ORDER — ACETAMINOPHEN 500 MG PO TABS
1000.0000 mg | ORAL_TABLET | Freq: Once | ORAL | Status: AC
  Administered 2018-04-18: 1000 mg via ORAL
  Filled 2018-04-18: qty 2

## 2018-04-18 NOTE — ED Notes (Addendum)
Pt attempted to sign, but signature pad will not take signature. Pt verbalizes understanding of d/c paperwork

## 2018-04-18 NOTE — ED Provider Notes (Signed)
[facility]-EMERGENCY DEPT Provider Note   CSN: [id number] Arrival date & time: 04/18/18  7829     History   Chief Complaint Chief Complaint  Patient presents with  . Back Pain  . Fever    HPI Matthew Pugh is a 22 y.o. male.  HPI Patient is otherwise healthy 22 year old male with history of asthma.  He does use daily inhalers.  Patient has had ongoing problems with mid and low back pain for between 6 months to a year.  Typically pain is aching in quality and he describes it as being diffusely in the lower thoracic and lumbar area.  It does not seem to localize centrally nor to one side or the other.  Pain has worsened over the past week.  At this morning he awakened with a fever.  Patient has never had fever previously in association with pain.  He has generalized aching headache and pain into his neck and upper back as well.  No nausea, no vomiting, no photophobia.  Patient denies abdominal pain.  He is sexually active with one male partner.  Denies history of STD.  Denies pain burning urgency with urination.  Denies penile discharge or testicular pain.  Patient did try 400 mg of ibuprofen this morning at about 6 AM.  He has seen his primary care doctor regarding the back pain.  It was thought to be musculoskeletal due to a lot of standing at work.  No specific diagnostic evaluation has been pursued.  Patient's mother is a Designer, jewellery.  She reports he complains of it intermittently but he is not seem to be consistently in pain.  Patient denies any history of IV drug abuse.  Only drug use is occasional marijuana.  Patient does not recall any tick bites or mosquito bites.  He does not spend a lot of time outside in grassy or woody environment.  No rashes. Past Medical History:  Diagnosis Date  . Allergy   . Asthma   . Egg allergy   . Food allergy   . Food allergy, peanut   . Seasonal allergies   . Shellfish allergy     Patient Active Problem List   Diagnosis Date Noted  . Routine general medical examination at a health care facility 11/02/2017  . Asthma 08/28/2017  . Other allergic rhinitis 08/28/2017  . Food allergy 08/28/2017    History reviewed. No pertinent surgical history.      Home Medications    Prior to Admission medications   Medication Sig Start Date End Date Taking? Authorizing Provider  cetirizine (ZYRTEC) 10 MG tablet Take 10 mg by mouth daily.   Yes [provider]  EPINEPHrine (EPIPEN) 0.3 mg/0.3 mL DEVI Inject 0.3 mLs (0.3 mg total) into the muscle as needed. Patient taking differently: Inject 0.3 mg into the muscle as needed (anaphylaxis).  03/02/13  Yes Lowanda Foster, NP  acetaminophen (TYLENOL) 500 MG tablet Take 2 tablets (1,000 mg total) by mouth every 6 (six) hours as needed. 04/18/18   Arby Barrette, MD  albuterol (PROVENTIL) (2.5 MG/3ML) 0.083% nebulizer solution Take 3 mLs (2.5 mg total) by nebulization every 6 (six) hours as needed for wheezing or shortness of breath. Patient not taking: Reported on 04/18/2018 08/28/17   Bobbitt, Heywood Iles, MD  albuterol (VENTOLIN HFA) 108 (90 Base) MCG/ACT inhaler Inhale 1-2 puffs into the lungs every 6 (six) hours as needed for wheezing or shortness of breath. Patient not taking: Reported on 12/03/2017 08/28/17   Bobbitt,  Heywood Iles, MD  fluticasone [facility]) 50 MCG/ACT nasal spray Place 2 sprays into both nostrils daily. As needed Patient not taking: Reported on 12/03/2017 08/28/17   Bobbitt, Heywood Iles, MD  fluticasone (FLOVENT HFA) 110 MCG/ACT inhaler Inhale 2 puffs into the lungs 2 (two) times daily. Rinse, gargle and spit out after use Patient not taking: Reported on 12/03/2017 08/28/17   Bobbitt, Heywood Iles, MD  ibuprofen (ADVIL,MOTRIN) 800 MG tablet Take 1 tablet (800 mg total) by mouth 3 (three) times daily. 04/18/18   Arby Barrette, MD    Family History Family History  Problem Relation Age of Onset  . Asthma Sister   . Kidney disease Maternal  Grandfather   . Kidney disease Paternal Grandmother   . Stroke Paternal Grandmother   . Hypertension Paternal Grandfather   . Stroke Paternal Grandfather     Social History Social History   Tobacco Use  . Smoking status: Never Smoker  . Smokeless tobacco: Never Used  Substance Use Topics  . Alcohol use: No  . Drug use: Yes    Types: Marijuana     Allergies   Cephalosporins; Eggs or egg-derived products; Peanut-containing drug products; Penicillins; Poultry meal; Shellfish allergy; Mylanta [alum & mag hydroxide-simeth]; and Zithromax [azithromycin]   Review of Systems Review of Systems 10 Systems reviewed and are negative for acute change except as noted in the HPI.   Physical Exam Updated Vital Signs BP (!) 100/58 (BP Location: Left Arm)   Pulse 67   Temp 97.9 F (36.6 C) (Oral)   Resp 14   Ht 5\' 9"  (1.753 m)   Wt 59 kg (130 lb)   SpO2 100%   BMI 19.20 kg/m   Physical Exam  Constitutional: He is oriented to person, place, and time. He appears well-developed and well-nourished.  Patient appears uncomfortable but is clinically well in appearance.  He is nontoxic.  No respiratory distress.  Mental status is clear.  Well-nourished well-developed.  Skin is warm and dry.  HENT:  Head: Normocephalic and atraumatic.  Nose: Nose normal.  Mouth/Throat: Oropharynx is clear and moist.  Right TM normal.  Left TM 50% visualized due to cerumen.  No erythema.  Eyes: Pupils are equal, round, and reactive to light. Conjunctivae and EOM are normal.  Neck: Neck supple.  No meningismus or lymphadenopathy.  Cardiovascular: Regular rhythm, normal heart sounds and intact distal pulses.  Borderline tachycardia.  Pulmonary/Chest: Effort normal and breath sounds normal.  Abdominal: Soft. Bowel sounds are normal. He exhibits no distension. There is no tenderness.  Musculoskeletal: Normal range of motion. He exhibits no edema, tenderness or deformity.  No peripheral edema.  Skin  condition of the lower extremities is excellent.  No rashes wounds or lesions to the feet or lower legs.  No rashes on the hands or arms.  No erythematous or tender joints.  Neurological: He is alert and oriented to person, place, and time. He has normal strength. He exhibits normal muscle tone. Coordination normal. GCS eye subscore is 4. GCS verbal subscore is 5. GCS motor subscore is 6.  Skin: Skin is warm, dry and intact.  Psychiatric: He has a normal mood and affect.     ED Treatments / Results  Labs (all labs ordered are listed, but only abnormal results are displayed) Labs Reviewed  COMPREHENSIVE METABOLIC PANEL - Abnormal; Notable for the following components:      Result Value   Glucose, Bld 101 (*)    Creatinine, Ser 1.29 (*)  All other components within normal limits  URINALYSIS, ROUTINE W REFLEX MICROSCOPIC - Abnormal; Notable for the following components:   Ketones, ur 5 (*)    All other components within normal limits  RAPID URINE DRUG SCREEN, HOSP PERFORMED - Abnormal; Notable for the following components:   Tetrahydrocannabinol POSITIVE (*)    Barbiturates   (*)    Value: Result not available. Reagent lot number recalled by manufacturer.   All other components within normal limits  GROUP A STREP BY PCR  CULTURE, BLOOD (ROUTINE X 2)  CULTURE, BLOOD (ROUTINE X 2)  CSF CULTURE  GRAM STAIN  CBC WITH DIFFERENTIAL/PLATELET  RPR  HIV ANTIBODY (ROUTINE TESTING)  CSF CELL COUNT WITH DIFFERENTIAL  CSF CELL COUNT WITH DIFFERENTIAL  GLUCOSE, CSF  PROTEIN, CSF  ARBOVIRUS IGG, CSF  I-STAT CG4 LACTIC ACID, ED  GC/CHLAMYDIA PROBE AMP ([place]) NOT AT [facility]    EKG EKG Interpretation  Date/Time:  Thursday April 18 2018 09:36:47 EDT Ventricular Rate:  93 PR Interval:    QRS Duration: 74 QT Interval:  327 QTC Calculation: 407 R Axis:   16 Text Interpretation:  Sinus rhythm normal no old comparison Confirmed by Arby Barrette [phone]) on 04/18/2018 10:20:43  AM   Radiology Dg Chest 2 View  Result Date: 04/18/2018 CLINICAL DATA:  Back pain. EXAM: CHEST - 2 VIEW COMPARISON:  12/03/2017. FINDINGS: Mediastinum and hilar structures normal. Lungs are clear. Heart size normal. No pleural effusion or pneumothorax. No acute bony abnormality. IMPRESSION: No acute cardiopulmonary disease. Electronically Signed   By: Maisie Fus  Register   On: 04/18/2018 09:53   Ct Renal Stone Study  Result Date: 04/18/2018 CLINICAL DATA:  Flank pain and fever EXAM: CT ABDOMEN AND PELVIS WITHOUT CONTRAST TECHNIQUE: Multidetector CT imaging of the abdomen and pelvis was performed following the standard protocol without oral or IV contrast. COMPARISON:  None. FINDINGS: Lower chest: Lung bases are clear. Hepatobiliary: No focal liver lesions are evident on this noncontrast enhanced study. Gallbladder wall is not appreciably thickened. There is no appreciable biliary duct dilatation. Pancreas: No pancreatic mass or inflammatory focus. Spleen: No splenic lesions are evident. Adrenals/Urinary Tract: Adrenals bilaterally appear normal. Kidneys bilaterally show no evident mass or hydronephrosis on either side. There is no renal or ureteral calculus on either side. Urinary bladder is midline. Urinary bladder wall is thickened. Stomach/Bowel: There is no appreciable bowel wall or mesenteric thickening. No evident bowel obstruction. No free air or portal venous air. Vascular/Lymphatic: No abdominal aortic aneurysm. No vascular lesions are evident on this noncontrast enhanced study. No adenopathy is appreciable in the abdomen or pelvis. Reproductive: Prostate and seminal vesicles appear normal in size and contour. No evident pelvic mass. Other: Appendix appears normal. There is no abscess or ascites evident in the abdomen or pelvis. Musculoskeletal: There are no blastic or lytic bone lesions. No intramuscular or abdominal wall lesion. IMPRESSION: 1. Urinary bladder wall thickening, a finding felt to  represent a degree of cystitis. 2.  No renal or ureteral calculus.  No hydronephrosis. 3. Appendix appears normal. No evident bowel obstruction. No abscess in the abdomen or pelvis. Electronically Signed   By: Bretta Bang III M.D.   On: 04/18/2018 12:09    Procedures Procedures (including critical care time) CRITICAL CARE Performed by: Arby Barrette   Total critical care time: 30 minutes  Critical care time was exclusive of separately billable procedures and treating other patients.  Critical care was necessary to treat or prevent imminent or life-threatening deterioration.  Critical care was time spent personally by me on the following activities: development of treatment plan with patient and/or surrogate as well as nursing, discussions with consultants, evaluation of patient's response to treatment, examination of patient, obtaining history from patient or surrogate, ordering and performing treatments and interventions, ordering and review of laboratory studies, ordering and review of radiographic studies, pulse oximetry and re-evaluation of patient's condition. Medications Ordered in ED Medications  0.9 %  sodium chloride infusion ( Intravenous Refused 04/18/18 1351)  sodium chloride 0.9 % bolus 1,000 mL (0 mLs Intravenous Stopped 04/18/18 1039)    And  sodium chloride 0.9 % bolus 1,000 mL (0 mLs Intravenous Stopped 04/18/18 1236)  acetaminophen (TYLENOL) tablet 1,000 mg (1,000 mg Oral Given 04/18/18 0930)  ibuprofen (ADVIL,MOTRIN) tablet 400 mg (400 mg Oral Given 04/18/18 0930)     Initial Impression / Assessment and Plan / ED Course  I have reviewed the triage vital signs and the nursing notes.  Pertinent labs & imaging results that were available during my care of the patient were reviewed by me and considered in my medical decision making (see chart for details).      Final Clinical Impressions(s) / ED Diagnoses   Final diagnoses:  Fever, unspecified fever cause   Acute nonintractable headache, unspecified headache type  Bilateral low back pain without sciatica, unspecified chronicity   Patient presents as outlined above with headache and fever.  He has had months of intermittent back pain.  This has not been midline.  Patient unequivocally denies any IV drug abuse history.  At this time I do not have suspicion for such.  No physical exam findings or social history to suggest history of IVDA.  Given these factors, have low suspicion for epidural abscess or vertebral osteomyelitis.  Question is whether incidental fever and headache today with no association to patient's intermittent back pain.  No signs of UTI and CT stone study shows no renal anomaly.  diagnostic evaluation has returned normal.  Patient does not have history to suggest zoonotic infection.  He does not have high outdoor exposure nor history of tick bite.  Review of systems and physical exam do not localize a likely source.  I did discuss proceeding with lumbar puncture with the patient's mother and patient.  She was agreeable to proceed with this but patient refused.  He is alert and appropriate.  He shows no signs of confusion or compromised decision-making capacity.  At this point, if patient has meningitis it would be most consistent with viral meningitis.  Once rehydrated and fever controlled he was clinically well in appearance with normal mental status.  We have reviewed a plan for immediate return should he have any worsening symptoms develop confusion or other localizing symptoms.. ED Discharge Orders        Ordered    ibuprofen (ADVIL,MOTRIN) 800 MG tablet  3 times daily     04/18/18 1320    acetaminophen (TYLENOL) 500 MG tablet  Every 6 hours PRN     04/18/18 1320       Arby BarrettePfeiffer, Darcy Barbara, MD 04/18/18 1634

## 2018-04-18 NOTE — ED Notes (Signed)
Patient is currently attempting to use urinal

## 2018-04-18 NOTE — ED Notes (Addendum)
Pt reports he is ready to go, does not want further testing, MD Pfeiffer made aware

## 2018-04-18 NOTE — ED Triage Notes (Signed)
Patient here from home with complaints of back pain "all over", fever, neck pain. Pain 10/10.

## 2018-04-18 NOTE — Discharge Instructions (Signed)
1.  Take ibuprofen and acetaminophen as prescribed to control fever and body ache.  Drink plenty of fluids. 2.  Return to the emergency department immediately if your symptoms are worsening such as any sign of confusion, fevers not controlled by medication, neck stiffness, light sensitivity, vomiting, body rash or any other concerning symptoms. 3. See your doctor for recheck in 1-3 days.

## 2018-04-18 NOTE — ED Notes (Signed)
Pt refuses to have HIV/RPR, GC/Chlamydia tests done

## 2018-04-23 ENCOUNTER — Inpatient Hospital Stay: Payer: 59 | Admitting: Internal Medicine

## 2018-04-23 LAB — CULTURE, BLOOD (ROUTINE X 2)
CULTURE: NO GROWTH
Culture: NO GROWTH
SPECIAL REQUESTS: ADEQUATE
Special Requests: ADEQUATE

## 2018-06-04 ENCOUNTER — Ambulatory Visit: Payer: 59 | Admitting: Internal Medicine

## 2018-06-04 ENCOUNTER — Other Ambulatory Visit: Payer: 59

## 2018-06-04 ENCOUNTER — Encounter: Payer: Self-pay | Admitting: Internal Medicine

## 2018-06-04 VITALS — BP 110/60 | HR 71 | Temp 97.9°F | Ht 69.0 in | Wt 126.0 lb

## 2018-06-04 DIAGNOSIS — Z202 Contact with and (suspected) exposure to infections with a predominantly sexual mode of transmission: Secondary | ICD-10-CM | POA: Diagnosis not present

## 2018-06-04 NOTE — Patient Instructions (Signed)
We will get you tested today and call you back about the results in 1-2 days.

## 2018-06-04 NOTE — Progress Notes (Signed)
   Subjective:    Patient ID: Matthew Pugh, male    DOB: Sep 12, 1996, 22 y.o.   MRN: [id number]  HPI The patient is a 22 YO man coming in for rash on genitals for about 1 days. Denies itching or burning. Denies discharge. Overall stable since he noticed it. Denies fevers or chills. Does have sexual partner currently and has been active recently. Does not always use condoms. The rash is on his penis. The scrotum is not involved. Denies swelling.   Review of Systems  Constitutional: Negative.   HENT: Negative.   Eyes: Negative.   Respiratory: Negative for cough, chest tightness and shortness of breath.   Cardiovascular: Negative for chest pain, palpitations and leg swelling.  Gastrointestinal: Negative for abdominal distention, abdominal pain, constipation, diarrhea, nausea and vomiting.  Musculoskeletal: Negative.   Skin: Positive for rash.  Neurological: Negative.   Psychiatric/Behavioral: Negative.       Objective:   Physical Exam  Constitutional: He is oriented to person, place, and time. He appears well-developed and well-nourished.  HENT:  Head: Normocephalic and atraumatic.  Eyes: EOM are normal.  Neck: Normal range of motion.  Cardiovascular: Normal rate and regular rhythm.  Pulmonary/Chest: Effort normal and breath sounds normal. No respiratory distress. He has no wheezes. He has no rales.  Abdominal: Soft. There is no tenderness. There is no rebound.  Genitourinary:  Genitourinary Comments: Rash on the shaft of the penis, no involvement on the scrotum, no LAD regional  Musculoskeletal: He exhibits no edema.  Neurological: He is alert and oriented to person, place, and time. Coordination normal.  Skin: Skin is warm and dry.  Psychiatric: He has a normal mood and affect.   Vitals:   06/04/18 1430  BP: 110/60  Pulse: 71  Temp: 97.9 F (36.6 C)  TempSrc: Oral  SpO2: 98%  Weight: 126 lb (57.2 kg)  Height: 5\' 9"  (1.753 m)      Assessment & Plan:

## 2018-06-04 NOTE — Assessment & Plan Note (Signed)
Checking GC/chlamydia and syphilis and HIV. Talked to him about the importance of safe sex with condoms every time. Treat as appropriate.

## 2018-06-05 LAB — HIV ANTIBODY (ROUTINE TESTING W REFLEX): HIV 1&2 Ab, 4th Generation: NONREACTIVE

## 2018-06-05 LAB — RPR: RPR Ser Ql: NONREACTIVE

## 2018-06-06 ENCOUNTER — Ambulatory Visit: Payer: Self-pay | Admitting: Internal Medicine

## 2018-06-06 ENCOUNTER — Telehealth: Payer: Self-pay | Admitting: Internal Medicine

## 2018-06-06 NOTE — Telephone Encounter (Signed)
Right side of the neck has a swollen lymph, sore throat. Mother would like to speak with nurse.   Patient's mother is calling to report that patient has developed marble size swelling in left lymph node of neck. He reports no pain- just red throat. Patient was just in the office recently.  [facility].  Reason for Disposition . [1] Very tender to the touch AND [2] no fever  Answer Assessment - Initial Assessment Questions 1. LOCATION: "Where is the swollen node located?" "Is the matching node on the other side of the body also swollen?"      Left side of neck- no 2. SIZE: "How big is the node?" (Inches or centimeters) (or compare to common objects such as pea, bean, marble, golf ball)      Marble size 3. ONSET: "When did the swelling start?"      yesterday 4. NECK NODES: "Is there a sore throat, runny nose or other symptoms of a cold?"      Red throat 5. GROIN OR ARMPIT NODES: "Is there a sore, scratch, cut or painful red area on that arm or leg?"      no 6. FEVER: "Do you have a fever?" If so, ask: "What is it, how was it measured, and when did it start?"      No- not aware 7. CAUSE: "What do you think is causing the swollen lymph nodes?"     infection 8. OTHER SYMPTOMS: "Do you have any other symptoms?"     no 9. PREGNANCY: "Is there any chance you are pregnant?" "When was your last menstrual period?"     n/a  Protocols used: LYMPH NODES SWOLLEN-A-AH

## 2018-06-06 NOTE — Telephone Encounter (Signed)
Will contact patient as soon as labs are resulted

## 2018-06-06 NOTE — Telephone Encounter (Signed)
Copied from CRM [id number]. Topic: Quick Communication - See Telephone Encounter >> Jun 06, 2018 10:47 AM Burchel, Abbi R wrote: CRM for notification. See Telephone encounter for: 06/06/18.  Pt's mother requesting lab results.  She was advised that they have not been released yet.  She requested a call as soon as they are available.  Please advise.   Archie Pattenonya (pt's mother) [phone]

## 2018-06-07 ENCOUNTER — Other Ambulatory Visit: Payer: Self-pay | Admitting: Internal Medicine

## 2018-06-07 LAB — GC/CHLAMYDIA PROBE AMP
CHLAMYDIA, DNA PROBE: POSITIVE — AB
NEISSERIA GONORRHOEAE BY PCR: NEGATIVE

## 2018-06-07 MED ORDER — AZITHROMYCIN 250 MG PO TABS: 1000.0000 mg | ORAL_TABLET | Freq: Once | ORAL | 0 refills | Status: AC

## 2018-06-07 NOTE — Telephone Encounter (Signed)
Pt.'s mother called back. Reports his nasal drainage has stopped, no cough. Still has a sore throat and a lymph node on left side of neck.

## 2018-06-07 NOTE — Telephone Encounter (Signed)
Patients mother informed of MD response  

## 2018-06-07 NOTE — Telephone Encounter (Signed)
Mother wanting to know if since patient was just in if anything can be sent in for patients sore throat and swollen lymph node on right side of neck that is marble sized.

## 2018-06-07 NOTE — Telephone Encounter (Signed)
Most sore throat is viral, is he having any drainage? Is there cough?

## 2018-06-07 NOTE — Telephone Encounter (Signed)
LVM for patient's mother to call back and let us know if patient is having drainage if so what color. Is patient coughing if so is it dry or productive?

## 2018-06-07 NOTE — Telephone Encounter (Signed)
Likely is viral and should wait 1-2 weeks for the lymph node to go away. Can take zyrtec over the counter for drainage. Use tylenol for pain if needed.

## 2018-07-08 ENCOUNTER — Telehealth: Payer: Self-pay

## 2018-07-08 NOTE — Telephone Encounter (Signed)
Detailed message left advising that we have no coupons for Flovent. I did advise to go online and see what they were able to find. I reminded patient/mom that he is overdue for an office visit as well and to contact our office at his/her convenience to set that up.

## 2018-07-08 NOTE — Telephone Encounter (Signed)
Patients mom is calling to see if we have any Flovent Coupons   [phone]- [facility] Mom

## 2018-08-01 ENCOUNTER — Encounter: Payer: 59 | Admitting: Adult Health

## 2018-10-04 ENCOUNTER — Other Ambulatory Visit: Payer: Self-pay | Admitting: Allergy and Immunology

## 2018-10-04 DIAGNOSIS — J45901 Unspecified asthma with (acute) exacerbation: Secondary | ICD-10-CM

## 2018-12-05 ENCOUNTER — Ambulatory Visit (INDEPENDENT_AMBULATORY_CARE_PROVIDER_SITE_OTHER): Payer: 59 | Admitting: Allergy

## 2018-12-05 ENCOUNTER — Encounter: Payer: Self-pay | Admitting: Allergy

## 2018-12-05 ENCOUNTER — Telehealth: Payer: Self-pay

## 2018-12-05 VITALS — BP 114/70 | HR 79 | Resp 16 | Ht 68.5 in | Wt 129.4 lb

## 2018-12-05 DIAGNOSIS — J3089 Other allergic rhinitis: Secondary | ICD-10-CM | POA: Diagnosis not present

## 2018-12-05 DIAGNOSIS — T7800XD Anaphylactic reaction due to unspecified food, subsequent encounter: Secondary | ICD-10-CM | POA: Diagnosis not present

## 2018-12-05 DIAGNOSIS — J4540 Moderate persistent asthma, uncomplicated: Secondary | ICD-10-CM

## 2018-12-05 MED ORDER — EPINEPHRINE 0.3 MG/0.3ML IJ SOAJ: 0.3000 mg | INTRAMUSCULAR | 1 refills | Status: DC | PRN

## 2018-12-05 MED ORDER — ALBUTEROL SULFATE HFA 108 (90 BASE) MCG/ACT IN AERS: 1.0000 | INHALATION_SPRAY | Freq: Four times a day (QID) | RESPIRATORY_TRACT | 2 refills | Status: DC | PRN

## 2018-12-05 MED ORDER — FLUTICASONE PROPIONATE HFA 110 MCG/ACT IN AERO: 2.0000 | INHALATION_SPRAY | Freq: Two times a day (BID) | RESPIRATORY_TRACT | 5 refills | Status: DC

## 2018-12-05 MED ORDER — ALBUTEROL SULFATE (2.5 MG/3ML) 0.083% IN NEBU: 2.5000 mg | INHALATION_SOLUTION | Freq: Four times a day (QID) | RESPIRATORY_TRACT | 3 refills | Status: DC | PRN

## 2018-12-05 NOTE — Assessment & Plan Note (Signed)
Stable with zyrtec 10mg  daily.  Continue appropriate allergen avoidance measures.  May use over the counter antihistamines such as Zyrtec (cetirizine), Claritin (loratadine), Allegra (fexofenadine), or Xyzal (levocetirizine) daily as needed.

## 2018-12-05 NOTE — Telephone Encounter (Signed)
error 

## 2018-12-05 NOTE — Assessment & Plan Note (Signed)
No accidental ingestion.   Continue careful avoidance of chicken, [place], fish, eggs, peanuts, and tree nuts and have access to epinephrine autoinjector 2 pack in case of accidental ingestion  A refill prescription has been provided for epinephrine 0.3 mg autoinjector 2 pack along with instructions for its proper administration.

## 2018-12-05 NOTE — Assessment & Plan Note (Signed)
   Not using daily inhaler.  Today's spirometry showed mild obstruction and no improvement in FEV1 post bronchodilator treatment which may have been due to his poor effort.  Using albuterol twice a week at work due to SOB with exertion.  Decrease smoking marijuana.  Daily controller medication(s): Start Flovent 110 2 puffs twice a day with spacer and rinse mouth afterwards.  Prior to physical activity: May use albuterol rescue inhaler 2 puffs 5 to 15 minutes prior to strenuous physical activities. Rescue medications: May use albuterol rescue inhaler 2 puffs or nebulizer every 4 to 6 hours as needed for shortness of breath, chest tightness, coughing, and wheezing. Monitor frequency of use.

## 2018-12-05 NOTE — Progress Notes (Signed)
Follow Up Note  RE: Matthew Pugh MRN: [id number] DOB: 24-Jan-1996 Date of Office Visit: 12/05/2018  Referring provider: Myrlene Broker, * Primary care provider: Myrlene Broker, MD  Chief Complaint: Asthma  History of Present Illness: I had the pleasure of seeing Matthew Pugh for a follow up visit at the [facility] on 12/05/2018. He is a 23 y.o. male, who is being followed for asthma, allergic rhinitis, food allergies. Today he is here for regular follow up visit and refill of medications. His previous allergy office visit was on 08/28/2017 with Dr. Nunzio Cobbs. Patient is accompanied by his father.   Asthma Currently using albuterol twice a week for SOB at work. He works at [employer]. Denies any ER/urgent care visits or prednisone use since the last visit. Not using Flovent daily as instructed per last OV.   Currently smoking marijuana daily.   Other allergic rhinitis Taking zyrtec 10mg  daily with good benefit. No using any nasal sprays.   Food allergy  Currently avoiding chicken, [place], fish, eggs, peanuts, and tree nuts   No reactions since the last visit and did not have to use Epipen.   Assessment and Plan: Esiquio is a 23 y.o. male with: Asthma  Not using daily inhaler.  Today's spirometry showed mild obstruction and no improvement in FEV1 post bronchodilator treatment which may have been due to his poor effort.  Using albuterol twice a week at work due to SOB with exertion.  Decrease smoking marijuana.  Daily controller medication(s): Start Flovent 110 2 puffs twice a day with spacer and rinse mouth afterwards.  Prior to physical activity: May use albuterol rescue inhaler 2 puffs 5 to 15 minutes prior to strenuous physical activities. Rescue medications: May use albuterol rescue inhaler 2 puffs or nebulizer every 4 to 6 hours as needed for shortness of breath, chest tightness, coughing, and wheezing. Monitor frequency of use.   Other  allergic rhinitis Stable with zyrtec 10mg  daily.  Continue appropriate allergen avoidance measures.  May use over the counter antihistamines such as Zyrtec (cetirizine), Claritin (loratadine), Allegra (fexofenadine), or Xyzal (levocetirizine) daily as needed.  Anaphylactic shock due to adverse food reaction No accidental ingestion.   Continue careful avoidance of chicken, [place], fish, eggs, peanuts, and tree nuts and have access to epinephrine autoinjector 2 pack in case of accidental ingestion  A refill prescription has been provided for epinephrine 0.3 mg autoinjector 2 pack along with instructions for its proper administration.  Return in about 3 months (around 03/05/2019).  Meds ordered this encounter  Medications  . albuterol (VENTOLIN HFA) 108 (90 Base) MCG/ACT inhaler    Sig: Inhale 1-2 puffs into the lungs every 6 (six) hours as needed for wheezing or shortness of breath.    Dispense:  1 Inhaler    Refill:  2  . DISCONTD: EPINEPHrine (EPIPEN) 0.3 mg/0.3 mL IJ SOAJ injection    Sig: Inject 0.3 mLs (0.3 mg total) into the muscle as needed.    Dispense:  2 Device    Refill:  1  . albuterol (PROVENTIL) (2.5 MG/3ML) 0.083% nebulizer solution    Sig: Take 3 mLs (2.5 mg total) by nebulization every 6 (six) hours as needed for wheezing or shortness of breath.    Dispense:  75 mL    Refill:  3  . fluticasone (FLOVENT HFA) 110 MCG/ACT inhaler    Sig: Inhale 2 puffs into the lungs 2 (two) times daily. Rinse, gargle and spit out after use  Dispense:  1 Inhaler    Refill:  5  . EPINEPHrine (EPIPEN) 0.3 mg/0.3 mL IJ SOAJ injection    Sig: Inject 0.3 mLs (0.3 mg total) into the muscle as needed.    Dispense:  2 Device    Refill:  1   Diagnostics: Spirometry:  Tracings reviewed. His effort: It was hard to get consistent efforts and there is a question as to whether this reflects a maximal maneuver. FVC: 4.62L FEV1: 2.84L, 73% predicted FEV1/FVC ratio: 61% Interpretation:  Spirometry consistent with mild obstructive disease with no improvement in FEV1 post bronchodilator treatment which may have been due to poor effort.  Please see scanned spirometry results for details.  Medication List:  Current Outpatient Medications  Medication Sig Dispense Refill  . acetaminophen (TYLENOL) 500 MG tablet Take 2 tablets (1,000 mg total) by mouth every 6 (six) hours as needed. 30 tablet 0  . cetirizine (ZYRTEC) 10 MG tablet Take 10 mg by mouth daily.    . fluticasone (FLONASE) 50 MCG/ACT nasal spray Place 2 sprays into both nostrils daily. As needed 16 g 5  . ibuprofen (ADVIL,MOTRIN) 800 MG tablet Take 1 tablet (800 mg total) by mouth 3 (three) times daily. 21 tablet 0  . albuterol (PROVENTIL) (2.5 MG/3ML) 0.083% nebulizer solution Take 3 mLs (2.5 mg total) by nebulization every 6 (six) hours as needed for wheezing or shortness of breath. 75 mL 3  . albuterol (VENTOLIN HFA) 108 (90 Base) MCG/ACT inhaler Inhale 1-2 puffs into the lungs every 6 (six) hours as needed for wheezing or shortness of breath. 1 Inhaler 2  . EPINEPHrine (EPIPEN) 0.3 mg/0.3 mL IJ SOAJ injection Inject 0.3 mLs (0.3 mg total) into the muscle as needed. 2 Device 1  . fluticasone (FLOVENT HFA) 110 MCG/ACT inhaler Inhale 2 puffs into the lungs 2 (two) times daily. Rinse, gargle and spit out after use 1 Inhaler 5   No current facility-administered medications for this visit.    Allergies: Allergies  Allergen Reactions  . Cephalosporins Anaphylaxis    Nose and throat swelling  . Eggs Or Egg-Derived Products Anaphylaxis  . Peanut-Containing Drug Products Anaphylaxis  . Penicillins Hives, Shortness Of Breath, Itching and Rash  . Poultry Meal Anaphylaxis    All poultry  . Shellfish Allergy Anaphylaxis  . Mylanta [Alum & Mag Hydroxide-Simeth] Other (See Comments)    Nose swelling and cough  . Zithromax [Azithromycin] Rash   I reviewed his past medical history, social history, family history, and  environmental history and no significant changes have been reported from previous visit on 08/28/2017.  Review of Systems  Constitutional: Negative for appetite change, chills, fever and unexpected weight change.  HENT: Negative for congestion and rhinorrhea.   Eyes: Negative for itching.  Respiratory: Negative for cough, chest tightness, shortness of breath and wheezing.   Gastrointestinal: Negative for abdominal pain.  Skin: Negative for rash.  Allergic/Immunologic: Positive for environmental allergies and food allergies.  Neurological: Negative for headaches.   Objective: BP 114/70 (BP Location: Left Arm, Patient Position: Sitting, Cuff Size: Normal)   Pulse 79   Resp 16   Ht 5' 8.5" (1.74 m)   Wt 129 lb 6.4 oz (58.7 kg)   SpO2 97%   BMI 19.39 kg/m  Body mass index is 19.39 kg/m. Physical Exam  Constitutional: He is oriented to person, place, and time. He appears well-developed and well-nourished.  HENT:  Head: Normocephalic and atraumatic.  Right Ear: External ear normal.  Left Ear: External ear normal.  Nose: Nose normal.  Mouth/Throat: Oropharynx is clear and moist.  Eyes: Conjunctivae and EOM are normal.  Neck: Neck supple.  Cardiovascular: Normal rate, regular rhythm and normal heart sounds. Exam reveals no gallop and no friction rub.  No murmur heard. Pulmonary/Chest: Effort normal and breath sounds normal. He has no wheezes. He has no rales.  Lymphadenopathy:    He has no cervical adenopathy.  Neurological: He is alert and oriented to person, place, and time.  Skin: Skin is warm. No rash noted.  Psychiatric: He has a normal mood and affect. His behavior is normal.  Nursing note and vitals reviewed.  Previous notes and tests were reviewed. The plan was reviewed with the patient/family, and all questions/concerned were addressed.  It was my pleasure to see Rickardo today and participate in his care. Please feel free to contact me with any questions or  concerns.  Sincerely,  Wyline Mood, DO Allergy & Immunology  [facility] office: [phone] [facility] office: [phone]

## 2018-12-05 NOTE — Patient Instructions (Addendum)
Asthma Decrease smoking marijuana.  Daily controller medication(s): Start Flovent 110 2 puffs twice a day with spacer and rinse mouth afterwards.  Prior to physical activity: May use albuterol rescue inhaler 2 puffs 5 to 15 minutes prior to strenuous physical activities. Rescue medications: May use albuterol rescue inhaler 2 puffs or nebulizer every 4 to 6 hours as needed for shortness of breath, chest tightness, coughing, and wheezing. Monitor frequency of use.  Asthma control goals:  Full participation in all desired activities (may need albuterol before activity) Albuterol use two times or less a week on average (not counting use with activity) Cough interfering with sleep two times or less a month Oral steroids no more than once a year No hospitalizations  Other allergic rhinitis  Continue appropriate allergen avoidance measures.  May use over the counter antihistamines such as Zyrtec (cetirizine), Claritin (loratadine), Allegra (fexofenadine), or Xyzal (levocetirizine) daily as needed.  Food allergy  Continue careful avoidance of chicken, [place], fish, eggs, peanuts, and tree nuts and have access to epinephrine autoinjector 2 pack in case of accidental ingestion.  A refill prescription has been provided for epinephrine 0.3 mg autoinjector 2 pack along with instructions for its proper administration.  Follow up in 3 months

## 2018-12-19 ENCOUNTER — Other Ambulatory Visit: Payer: Self-pay

## 2018-12-19 ENCOUNTER — Emergency Department (HOSPITAL_COMMUNITY)
Admission: EM | Admit: 2018-12-19 | Discharge: 2018-12-20 | Disposition: A | Discharge status: Home / Self Care | Payer: 59 | Attending: Emergency Medicine | Admitting: Emergency Medicine

## 2018-12-19 DIAGNOSIS — K529 Noninfective gastroenteritis and colitis, unspecified: Secondary | ICD-10-CM

## 2018-12-19 DIAGNOSIS — Z880 Allergy status to penicillin: Secondary | ICD-10-CM | POA: Insufficient documentation

## 2018-12-19 DIAGNOSIS — J45909 Unspecified asthma, uncomplicated: Secondary | ICD-10-CM | POA: Insufficient documentation

## 2018-12-19 DIAGNOSIS — R112 Nausea with vomiting, unspecified: Secondary | ICD-10-CM | POA: Diagnosis present

## 2018-12-19 LAB — CBC
HCT: 52.2 % — ABNORMAL HIGH (ref 39.0–52.0)
Hemoglobin: 17.1 g/dL — ABNORMAL HIGH (ref 13.0–17.0)
MCH: 30 pg (ref 26.0–34.0)
MCHC: 32.8 g/dL (ref 30.0–36.0)
MCV: 91.6 fL (ref 80.0–100.0)
Platelets: 242 10*3/uL (ref 150–400)
RBC: 5.7 MIL/uL (ref 4.22–5.81)
RDW: 12 % (ref 11.5–15.5)
WBC: 8.8 10*3/uL (ref 4.0–10.5)
nRBC: 0 % (ref 0.0–0.2)

## 2018-12-19 LAB — COMPREHENSIVE METABOLIC PANEL
ALT: 28 U/L (ref 0–44)
ANION GAP: 9 (ref 5–15)
AST: 41 U/L (ref 15–41)
Albumin: 4.6 g/dL (ref 3.5–5.0)
Alkaline Phosphatase: 37 U/L — ABNORMAL LOW (ref 38–126)
BUN: 11 mg/dL (ref 6–20)
CO2: 22 mmol/L (ref 22–32)
Calcium: 9.1 mg/dL (ref 8.9–10.3)
Chloride: 110 mmol/L (ref 98–111)
Creatinine, Ser: 1.07 mg/dL (ref 0.61–1.24)
GFR calc Af Amer: >60 mL/min (ref >60)
GFR calc non Af Amer: >60 mL/min (ref >60)
Glucose, Bld: 99 mg/dL (ref 70–99)
Potassium: 3.7 mmol/L (ref 3.5–5.1)
Sodium: 141 mmol/L (ref 135–145)
Total Bilirubin: 1.4 mg/dL — ABNORMAL HIGH (ref 0.3–1.2)
Total Protein: 7.1 g/dL (ref 6.5–8.1)

## 2018-12-19 LAB — LIPASE, BLOOD: Lipase: 30 U/L (ref 11–51)

## 2018-12-19 MED ORDER — ONDANSETRON HCL 4 MG/2ML IJ SOLN
4.0000 mg | Freq: Once | INTRAMUSCULAR | Status: AC
  Administered 2018-12-19: 4 mg via INTRAVENOUS
  Filled 2018-12-19: qty 2

## 2018-12-19 MED ORDER — LOPERAMIDE HCL 2 MG PO CAPS: 2.0000 mg | ORAL_CAPSULE | Freq: Four times a day (QID) | ORAL | 0 refills | Status: DC | PRN

## 2018-12-19 MED ORDER — SODIUM CHLORIDE 0.9 % IV BOLUS
2000.0000 mL | Freq: Once | INTRAVENOUS | Status: AC
  Administered 2018-12-19: 2000 mL via INTRAVENOUS

## 2018-12-19 MED ORDER — ONDANSETRON 8 MG PO TBDP: 8.0000 mg | ORAL_TABLET | Freq: Three times a day (TID) | ORAL | 0 refills | Status: DC | PRN

## 2018-12-19 NOTE — ED Triage Notes (Signed)
Pt c/o N/V stomach pain since this morning. No known fever at home, emesis x7-8 times.

## 2018-12-19 NOTE — ED Notes (Signed)
Patient aware he needs urine collected. Urinal given.

## 2018-12-19 NOTE — Discharge Instructions (Signed)
Take the medications as prescribed, drink plenty of fluids, your symptoms should resolve over the next day or 2, return to the ED for worsening symptoms

## 2018-12-19 NOTE — ED Provider Notes (Signed)
[facility] EMERGENCY DEPARTMENT Provider Note   CSN: [id number] Arrival date & time: 12/19/18  2032    History   Chief Complaint Chief Complaint  Patient presents with  . Emesis  . Abdominal Pain    HPI Matthew Pugh is a 23 y.o. male.   HPI Patient presents to the emergency room for evaluation of abdominal pain associated with nausea and vomiting diarrhea.  Patient woke up this morning and started having multiple episodes of nausea and vomiting as well as diarrhea.  Patient states he has had at least 10 episodes of vomiting and diarrhea throughout the day.  No blood in the emesis or stool.  Patient began having diffuse abdominal cramping.  He also had chills but did not measure any fevers at home.  They did not have a thermometer.  Patient denies any prior abdominal surgeries.  No known ill contacts. Past Medical History:  Diagnosis Date  . Allergy   . Asthma   . Egg allergy   . Food allergy   . Food allergy, peanut   . Seasonal allergies   . Shellfish allergy     Patient Active Problem List   Diagnosis Date Noted  . Exposure to STD 06/04/2018  . Routine general medical examination at a health care facility 11/02/2017  . Asthma 08/28/2017  . Other allergic rhinitis 08/28/2017  . Anaphylactic shock due to adverse food reaction 08/28/2017    No past surgical history on file.      Home Medications    Prior to Admission medications   Medication Sig Start Date End Date Taking? Authorizing Provider  albuterol (PROVENTIL) (2.5 MG/3ML) 0.083% nebulizer solution Take 3 mLs (2.5 mg total) by nebulization every 6 (six) hours as needed for wheezing or shortness of breath. 12/05/18  Yes Ellamae Sia, DO  albuterol (VENTOLIN HFA) 108 (90 Base) MCG/ACT inhaler Inhale 1-2 puffs into the lungs every 6 (six) hours as needed for wheezing or shortness of breath. 12/05/18  Yes Ellamae Sia, DO  cetirizine (ZYRTEC) 10 MG tablet Take 10 mg by mouth daily as needed for  allergies.    Yes [provider]  EPINEPHrine (EPIPEN) 0.3 mg/0.3 mL IJ SOAJ injection Inject 0.3 mLs (0.3 mg total) into the muscle as needed. Patient taking differently: Inject 0.3 mg into the muscle as needed for anaphylaxis.  12/05/18  Yes Ellamae Sia, DO  fluticasone (FLOVENT HFA) 110 MCG/ACT inhaler Inhale 2 puffs into the lungs 2 (two) times daily. Rinse, gargle and spit out after use 12/05/18  Yes Ellamae Sia, DO  acetaminophen (TYLENOL) 500 MG tablet Take 2 tablets (1,000 mg total) by mouth every 6 (six) hours as needed. Patient not taking: Reported on 12/19/2018 04/18/18   Arby Barrette, MD  fluticasone [facility]) 50 MCG/ACT nasal spray Place 2 sprays into both nostrils daily. As needed Patient not taking: Reported on 12/19/2018 08/28/17   Bobbitt, Heywood Iles, MD  ibuprofen (ADVIL,MOTRIN) 800 MG tablet Take 1 tablet (800 mg total) by mouth 3 (three) times daily. Patient not taking: Reported on 12/19/2018 04/18/18   Arby Barrette, MD  loperamide (IMODIUM) 2 MG capsule Take 1 capsule (2 mg total) by mouth 4 (four) times daily as needed for diarrhea or loose stools. 12/19/18   Linwood Dibbles, MD  ondansetron (ZOFRAN ODT) 8 MG disintegrating tablet Take 1 tablet (8 mg total) by mouth every 8 (eight) hours as needed for nausea or vomiting. 12/19/18   Linwood Dibbles, MD  Family History Family History  Problem Relation Age of Onset  . Asthma Sister   . Kidney disease Maternal Grandfather   . Kidney disease Paternal Grandmother   . Stroke Paternal Grandmother   . Hypertension Paternal Grandfather   . Stroke Paternal Grandfather     Social History Social History   Tobacco Use  . Smoking status: Never Smoker  . Smokeless tobacco: Never Used  Substance Use Topics  . Alcohol use: No  . Drug use: Yes    Types: Marijuana     Allergies   Cephalosporins; Eggs or egg-derived products; Peanut-containing drug products; Penicillins; Poultry meal; Shellfish allergy; Mylanta [alum & mag  hydroxide-simeth]; and Zithromax [azithromycin]   Review of Systems Review of Systems  All other systems reviewed and are negative.    Physical Exam Updated Vital Signs BP 125/67   Pulse 84   Temp 98.3 F (36.8 C) (Oral)   Resp 18   Ht 1.727 m (5\' 8" )   Wt 63.5 kg   SpO2 100%   BMI 21.29 kg/m   Physical Exam Vitals signs and nursing note reviewed.  Constitutional:      General: He is not in acute distress.    Appearance: He is well-developed. He is ill-appearing.  HENT:     Head: Normocephalic and atraumatic.     Right Ear: External ear normal.     Left Ear: External ear normal.  Eyes:     General: No scleral icterus.       Right eye: No discharge.        Left eye: No discharge.     Conjunctiva/sclera: Conjunctivae normal.  Neck:     Musculoskeletal: Neck supple.     Trachea: No tracheal deviation.  Cardiovascular:     Rate and Rhythm: Normal rate and regular rhythm.  Pulmonary:     Effort: Pulmonary effort is normal. No respiratory distress.     Breath sounds: Normal breath sounds. No stridor. No wheezing or rales.  Abdominal:     General: Bowel sounds are normal. There is no distension.     Palpations: Abdomen is soft.     Tenderness: There is no abdominal tenderness. There is no guarding or rebound.  Musculoskeletal:        General: No tenderness.  Skin:    General: Skin is warm and dry.     Findings: No rash.  Neurological:     Mental Status: He is alert.     Cranial Nerves: No cranial nerve deficit (no facial droop, extraocular movements intact, no slurred speech).     Sensory: No sensory deficit.     Motor: No abnormal muscle tone or seizure activity.     Coordination: Coordination normal.      ED Treatments / Results  Labs (all labs ordered are listed, but only abnormal results are displayed) Labs Reviewed  COMPREHENSIVE METABOLIC PANEL - Abnormal; Notable for the following components:      Result Value   Alkaline Phosphatase 37 (*)     Total Bilirubin 1.4 (*)    All other components within normal limits  CBC - Abnormal; Notable for the following components:   Hemoglobin 17.1 (*)    HCT 52.2 (*)    All other components within normal limits  LIPASE, BLOOD  URINALYSIS, ROUTINE W REFLEX MICROSCOPIC    EKG None  Radiology No results found.  Procedures Procedures (including critical care time)  Medications Ordered in ED Medications  sodium chloride 0.9 % bolus 2,000  mL (2,000 mLs Intravenous New Bag/Given 12/19/18 2135)  ondansetron [facility]) injection 4 mg (4 mg Intravenous Given 12/19/18 2129)     Initial Impression / Assessment and Plan / ED Course  I have reviewed the triage vital signs and the nursing notes.  Pertinent labs & imaging results that were available during my care of the patient were reviewed by me and considered in my medical decision making (see chart for details).  Clinical Course as of Dec 19 2254  Thu Dec 19, 2018  2255 Labs reviewed.  No significant abnormalities.  Slight increase in bilirubin but I do not think this is clinically significant   [JK]  2255 He is feeling better.  He is able to tolerate liquids.  Feels ready for discharge.   [JK]    Clinical Course User Index [JK] Linwood Dibbles, MD   Patient presented to the emergency room for evaluation of nausea vomiting associated with abdominal cramping.  Patient had no palpable tenderness on exam.  He had numerous episodes of vomiting and diarrhea.  I suspect a viral gastroenteritis.  Patient has improved with IV hydration and antiemetics.  Appears stable for discharge.  Warning signs precautions discussed.  Final Clinical Impressions(s) / ED Diagnoses   Final diagnoses:  Gastroenteritis    ED Discharge Orders         Ordered    ondansetron (ZOFRAN ODT) 8 MG disintegrating tablet  Every 8 hours PRN     12/19/18 2246    loperamide (IMODIUM) 2 MG capsule  4 times daily PRN     12/19/18 2246           Linwood Dibbles, MD 12/19/18  2256

## 2018-12-20 NOTE — ED Notes (Signed)
Patient verbalizes understanding of discharge instructions. Opportunity for questioning and answers were provided. Armband removed by staff, pt discharged from ED.  

## 2019-08-06 ENCOUNTER — Encounter: Payer: Self-pay | Admitting: Allergy

## 2019-08-06 ENCOUNTER — Other Ambulatory Visit: Payer: Self-pay

## 2019-08-06 ENCOUNTER — Ambulatory Visit (INDEPENDENT_AMBULATORY_CARE_PROVIDER_SITE_OTHER): Payer: 59 | Admitting: Allergy

## 2019-08-06 VITALS — BP 118/78 | HR 82 | Temp 98.3°F | Resp 16 | Ht 67.5 in | Wt 125.8 lb

## 2019-08-06 DIAGNOSIS — J3089 Other allergic rhinitis: Secondary | ICD-10-CM | POA: Diagnosis not present

## 2019-08-06 DIAGNOSIS — J4541 Moderate persistent asthma with (acute) exacerbation: Secondary | ICD-10-CM | POA: Insufficient documentation

## 2019-08-06 DIAGNOSIS — T7800XD Anaphylactic reaction due to unspecified food, subsequent encounter: Secondary | ICD-10-CM

## 2019-08-06 NOTE — Patient Instructions (Addendum)
Asthma  Start prednisone taper.   Stop smoking.   START Symbicort 80 2 puffs twice a day with spacer and rinse mouth afterwards for 2 weeks then start taking Flovent 110 2 puffs once a day. Sample of Symbicort given. Spacer given.   Daily controller medication(s):Flovent 110 2 puffs once a day with spacer and rinse mouth afterwards.   Prior to physical activity:May use albuterol rescue inhaler 2 puffs 5 to 15 minutes prior to strenuous physical activities.  Rescue medications:May use albuterol rescue inhaler 2 puffs or nebulizer every 4 to 6 hours as needed for shortness of breath, chest tightness, coughing, and wheezing. Monitor frequency of use. During upper respiratory infections/asthma flares: Start Flovent 110 2 puffs twice a day with spacer and rinse mouth afterwards for 2 weeks at a time.  Asthma control goals:  Full participation in all desired activities (may need albuterol before activity) Albuterol use two times or less a week on average (not counting use with activity) Cough interfering with sleep two times or less a month Oral steroids no more than once a year No hospitalizations  Other allergic rhinitis  Continue appropriate allergen avoidance measures.  May use over the counter antihistamines such as Zyrtec (cetirizine), Claritin (loratadine), Allegra (fexofenadine), or Xyzal (levocetirizine) daily as needed.  Will repeat testing to environmental allergies in the future.  Anaphylactic shock due to adverse food reaction  Continue careful avoidance of chicken, [place], fish, eggs, peanuts, and tree nuts.  For mild symptoms you can take over the counter antihistamines such as Benadryl and monitor symptoms closely. If symptoms worsen or if you have severe symptoms including breathing issues, throat closure, significant swelling, whole body hives, severe diarrhea and vomiting, lightheadedness then inject epinephrine and seek immediate medical care afterwards.  Will  repeat skin testing at next visit.   Follow up in 2 months for skin testing and follow up. Stop zyrtec 3 days before.

## 2019-08-06 NOTE — Assessment & Plan Note (Signed)
Daily coughing and wheezing with no upper respiratory infection symptoms for the past week.  Using albuterol 2 puffs twice a day with some benefit.  Previously was not taking Flovent on a daily basis.  Restarted Flovent 110 2 puffs once a day this past week.  Today's spirometry showed: moderate obstructive disease with 66% improvement in FEV1 post bronchodilator treatment.   Start prednisone taper.   Stop smoking.   START Symbicort 80 2 puffs twice a day with spacer and rinse mouth afterwards for 2 weeks then start taking Flovent 110 2 puffs once a day. Sample of Symbicort given. Spacer given.   If symptoms not controlled with Flovent only then advised to call the office.   Daily controller medication(s):Flovent 110 2 puffs once a day with spacer and rinse mouth afterwards.   Prior to physical activity:May use albuterol rescue inhaler 2 puffs 5 to 15 minutes prior to strenuous physical activities.  Rescue medications:May use albuterol rescue inhaler 2 puffs or nebulizer every 4 to 6 hours as needed for shortness of breath, chest tightness, coughing, and wheezing. Monitor frequency of use. During upper respiratory infections/asthma flares: Start Flovent 110 2 puffs twice a day with spacer and rinse mouth afterwards for 2 weeks at a time.

## 2019-08-06 NOTE — Progress Notes (Signed)
Follow Up Note  RE: Matthew Pugh MRN: [id number] DOB: 10/01/1996 Date of Office Visit: 08/06/2019  Referring provider: Myrlene Brokerrawford, Elizabeth A, * Primary care provider: Myrlene Brokerrawford, Elizabeth A, MD  Chief Complaint: Asthma (has been coughing at night waking up), Cough, Allergic Rhinitis , Wheezing, and Food Intolerance (avoiding all allergies, thinks that he may have grown out of his allergies)  History of Present Illness: I had the pleasure of seeing Matthew Pugh for a follow up visit at the [facility] on 08/06/2019. He is a 23 y.o. male, who is being followed for asthma, allergic rhinitis and food allergies. Today he is here for regular follow up visit.  His previous allergy office visit was on 12/05/2018 with Dr. Selena BattenKim.   Asthma About 1 week ago he noticed some coughing and wheezing. No recent URI. Using albuterol 2 puffs BID with some benefit.  He also started to take Flovent 110 2 puffs once a day for the past week. Previously was only using it here and there.  No oral prednisone or ER/urgent care visit since the last visit.  Before this asthma flare was using albuterol twice a week.  Still smokes.   Other allergic rhinitis Stable with zyrtec 10mg  daily.  Food allergy Patient had chicken a few months ago with no issues.  But since then has been avoiding: chicken, [place], fish, eggs, peanuts, and tree nuts. No reactions to foods.   Assessment and Plan: Matthew Pugh is a 23 y.o. male with: Moderate persistent asthma with acute exacerbation Daily coughing and wheezing with no upper respiratory infection symptoms for the past week.  Using albuterol 2 puffs twice a day with some benefit.  Previously was not taking Flovent on a daily basis.  Restarted Flovent 110 2 puffs once a day this past week.  Today's spirometry showed: moderate obstructive disease with 66% improvement in FEV1 post bronchodilator treatment.   Start prednisone taper.   Stop smoking.   START  Symbicort 80 2 puffs twice a day with spacer and rinse mouth afterwards for 2 weeks then start taking Flovent 110 2 puffs once a day. Sample of Symbicort given. Spacer given.   If symptoms not controlled with Flovent only then advised to call the office.   Daily controller medication(s):Flovent 110 2 puffs once a day with spacer and rinse mouth afterwards.   Prior to physical activity:May use albuterol rescue inhaler 2 puffs 5 to 15 minutes prior to strenuous physical activities.  Rescue medications:May use albuterol rescue inhaler 2 puffs or nebulizer every 4 to 6 hours as needed for shortness of breath, chest tightness, coughing, and wheezing. Monitor frequency of use. During upper respiratory infections/asthma flares: Start Flovent 110 2 puffs twice a day with spacer and rinse mouth afterwards for 2 weeks at a time.   Other allergic rhinitis Stable with zyrtec 10mg  daily.  Continue appropriate allergen avoidance measures.  May use over the counter antihistamines such as Zyrtec (cetirizine), Claritin (loratadine), Allegra (fexofenadine), or Xyzal (levocetirizine) daily as needed.  No recent allergy testing.  Consider repeat environmental allergy panel skin testing at next visit.  Anaphylactic shock due to adverse food reaction Patient had chicken breast accidentally a few months ago with no issues.  No other accidental ingestions or reactions.  No recent food allergy testing.  Continue careful avoidance of chicken, [place], fish, eggs, peanuts, and tree nuts.  He may have outgrown the chicken allergy given the clinical history.   For mild symptoms you can take  over the counter antihistamines such as Benadryl and monitor symptoms closely. If symptoms worsen or if you have severe symptoms including breathing issues, throat closure, significant swelling, whole body hives, severe diarrhea and vomiting, lightheadedness then inject epinephrine and seek immediate medical care afterwards.   Will repeat skin testing at next visit.   Return in about 2 months (around 10/06/2019) for Skin testing.  Diagnostics: Spirometry:  Tracings reviewed. His effort: Good reproducible efforts. FVC: 4.04L FEV1: 1.91L, 52% predicted FEV1/FVC ratio: 47% Interpretation: Spirometry consistent with moderate obstructive disease with 66% improvement in FEV1 post bronchodilator treatment.  Please see scanned spirometry results for details.  Medication List:  Current Outpatient Medications  Medication Sig Dispense Refill  . albuterol (PROVENTIL) (2.5 MG/3ML) 0.083% nebulizer solution Take 3 mLs (2.5 mg total) by nebulization every 6 (six) hours as needed for wheezing or shortness of breath. 75 mL 3  . albuterol (VENTOLIN HFA) 108 (90 Base) MCG/ACT inhaler Inhale 1-2 puffs into the lungs every 6 (six) hours as needed for wheezing or shortness of breath. 1 Inhaler 2  . cetirizine (ZYRTEC) 10 MG tablet Take 10 mg by mouth daily as needed for allergies.     Marland Kitchen EPINEPHrine (EPIPEN) 0.3 mg/0.3 mL IJ SOAJ injection Inject 0.3 mLs (0.3 mg total) into the muscle as needed. (Patient taking differently: Inject 0.3 mg into the muscle as needed for anaphylaxis. ) 2 Device 1  . fluticasone (FLOVENT HFA) 110 MCG/ACT inhaler Inhale 2 puffs into the lungs 2 (two) times daily. Rinse, gargle and spit out after use 1 Inhaler 5  . fluticasone (FLONASE) 50 MCG/ACT nasal spray Place 2 sprays into both nostrils daily. As needed (Patient not taking: Reported on 08/06/2019) 16 g 5   No current facility-administered medications for this visit.    Allergies: Allergies  Allergen Reactions  . Cephalosporins Anaphylaxis    Nose and throat swelling  . Eggs Or Egg-Derived Products Anaphylaxis  . Peanut-Containing Drug Products Anaphylaxis  . Penicillins Hives, Shortness Of Breath, Itching and Rash  . Poultry Meal Anaphylaxis    All poultry  . Shellfish Allergy Anaphylaxis  . Mylanta [Alum & Mag Hydroxide-Simeth] Other (See  Comments)    Nose swelling and cough  . Zithromax [Azithromycin] Rash   I reviewed his past medical history, social history, family history, and environmental history and no significant changes have been reported from his previous visit.  Review of Systems  Constitutional: Negative for appetite change, chills, fever and unexpected weight change.  HENT: Negative for congestion and rhinorrhea.   Eyes: Negative for itching.  Respiratory: Positive for cough and wheezing. Negative for chest tightness and shortness of breath.   Gastrointestinal: Negative for abdominal pain.  Skin: Negative for rash.  Allergic/Immunologic: Positive for environmental allergies and food allergies.  Neurological: Negative for headaches.   Objective: BP 118/78 (BP Location: Right Arm, Patient Position: Sitting, Cuff Size: Normal)   Pulse 82   Temp 98.3 F (36.8 C) (Temporal)   Resp 16   Ht 5' 7.5" (1.715 m)   Wt 125 lb 12.8 oz (57.1 kg)   SpO2 97%   BMI 19.41 kg/m  Body mass index is 19.41 kg/m. Physical Exam  Constitutional: He is oriented to person, place, and time. He appears well-developed and well-nourished.  HENT:  Head: Normocephalic and atraumatic.  Right Ear: External ear normal.  Left Ear: External ear normal.  Nose: Nose normal.  Mouth/Throat: Oropharynx is clear and moist.  Eyes: Conjunctivae and EOM are normal.  Neck: Neck  supple.  Cardiovascular: Normal rate, regular rhythm and normal heart sounds. Exam reveals no gallop and no friction rub.  No murmur heard. Pulmonary/Chest: Effort normal. He has wheezes. He has no rales.  Mild wheezing on left lower lobe.  Neurological: He is alert and oriented to person, place, and time.  Skin: Skin is warm. No rash noted.  Psychiatric: He has a normal mood and affect. His behavior is normal.  Nursing note and vitals reviewed.  Previous notes and tests were reviewed. The plan was reviewed with the patient/family, and all questions/concerned were  addressed.  It was my pleasure to see Matthew Pugh today and participate in his care. Please feel free to contact me with any questions or concerns.  Sincerely,  Rexene Alberts, DO Allergy & Immunology  [facility] office: [phone] [facility] office: [place] office: [phone]

## 2019-08-06 NOTE — Assessment & Plan Note (Signed)
Patient had chicken breast accidentally a few months ago with no issues.  No other accidental ingestions or reactions.  No recent food allergy testing.  Continue careful avoidance of chicken, [place], fish, eggs, peanuts, and tree nuts.  He may have outgrown the chicken allergy given the clinical history.   For mild symptoms you can take over the counter antihistamines such as Benadryl and monitor symptoms closely. If symptoms worsen or if you have severe symptoms including breathing issues, throat closure, significant swelling, whole body hives, severe diarrhea and vomiting, lightheadedness then inject epinephrine and seek immediate medical care afterwards.  Will repeat skin testing at next visit.

## 2019-08-06 NOTE — Assessment & Plan Note (Signed)
Stable with zyrtec 10mg  daily.  Continue appropriate allergen avoidance measures.  May use over the counter antihistamines such as Zyrtec (cetirizine), Claritin (loratadine), Allegra (fexofenadine), or Xyzal (levocetirizine) daily as needed.  No recent allergy testing.  Consider repeat environmental allergy panel skin testing at next visit.

## 2019-10-06 IMAGING — CT CT RENAL STONE PROTOCOL
2 of 4 series · 17 of 46 positions shown, 19 images · non-contrast
Comparison: None.

CLINICAL DATA: Flank pain and fever

EXAM:
CT ABDOMEN AND PELVIS WITHOUT CONTRAST
TECHNIQUE: Multidetector CT imaging of the abdomen and pelvis was performed
following the standard protocol without oral or IV contrast.

[Series 2: axial st · axial · 0.60mm/px · z∈[+1136,+1511]mm · 14 of 85 slices shown, 16 images]
[im 5/85  soft-tissue]
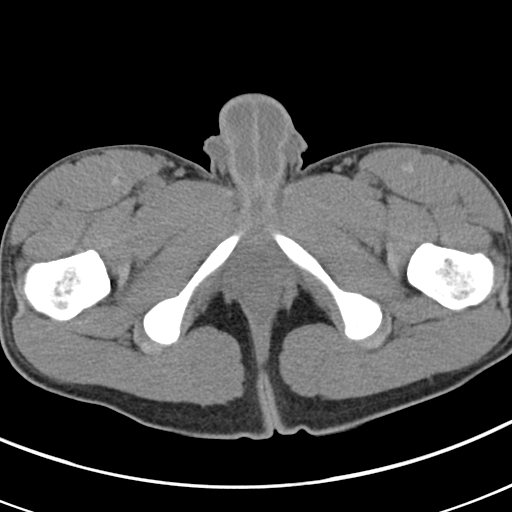
[im 5/85  bone]
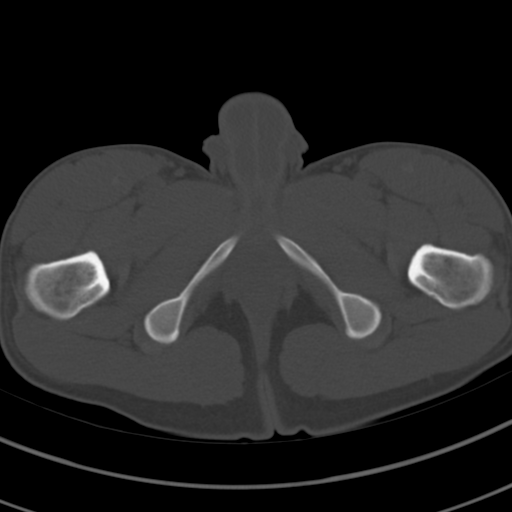
[im 13/85  soft-tissue]
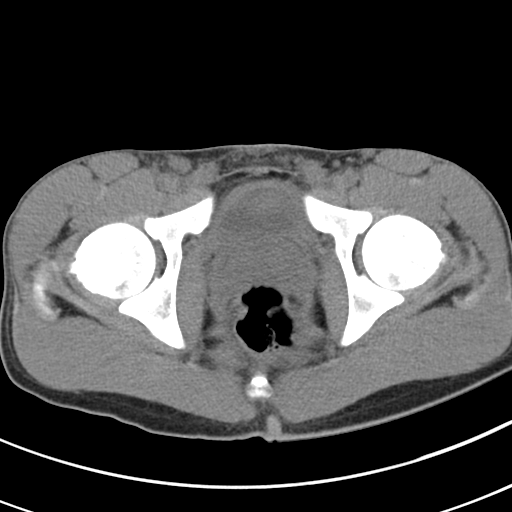
[im 17/85  soft-tissue]
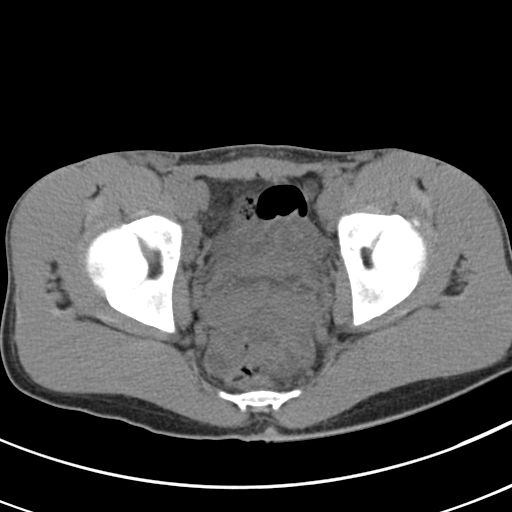
[im 22/85  soft-tissue]
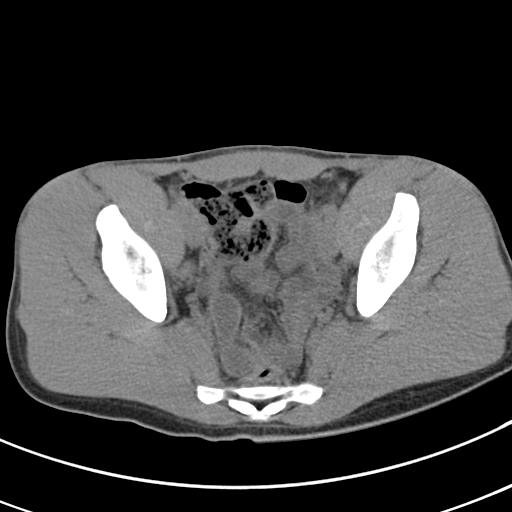
[im 30/85  soft-tissue]
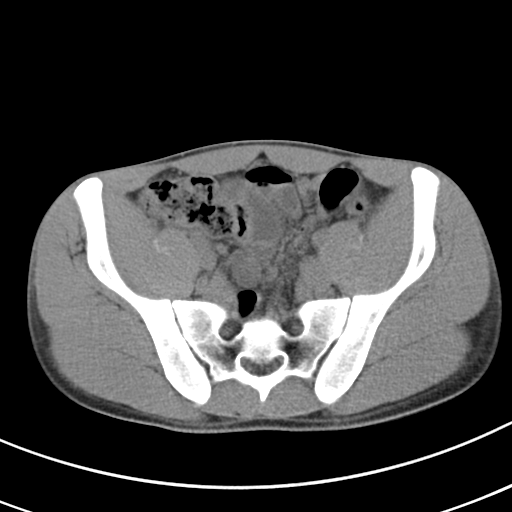
[im 34/85  soft-tissue]
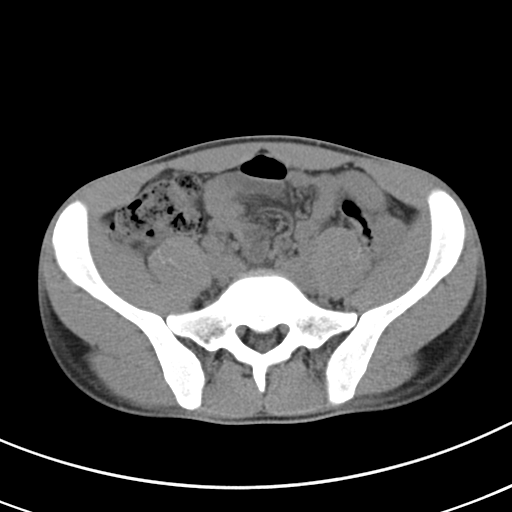
[im 38/85  soft-tissue]
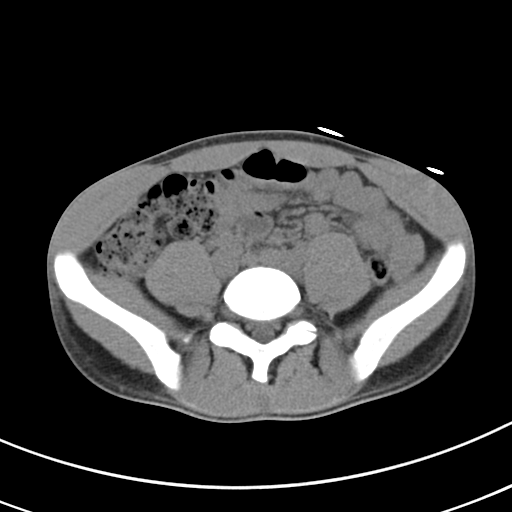
[im 47/85  soft-tissue]
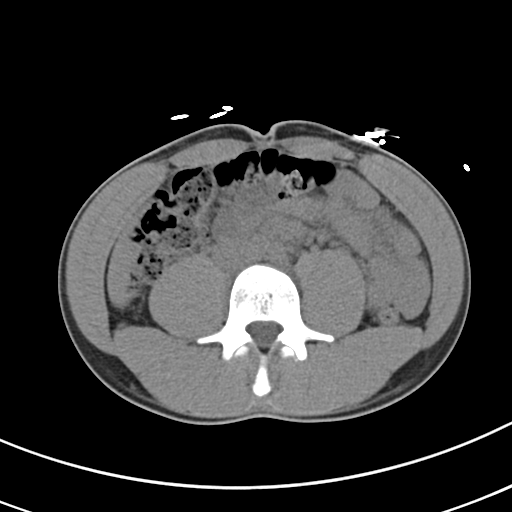
[im 51/85  soft-tissue]
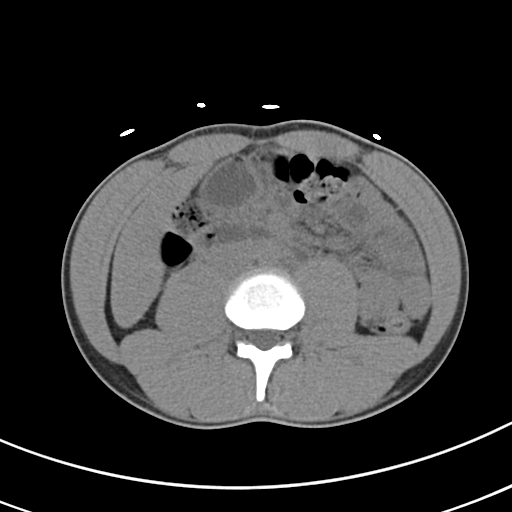
[im 51/85  bone]
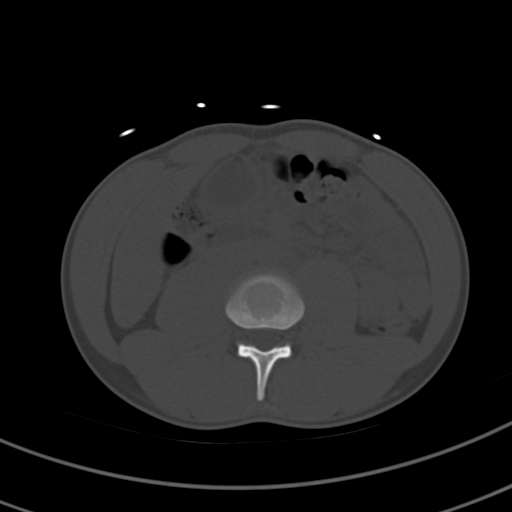
[im 55/85  soft-tissue]
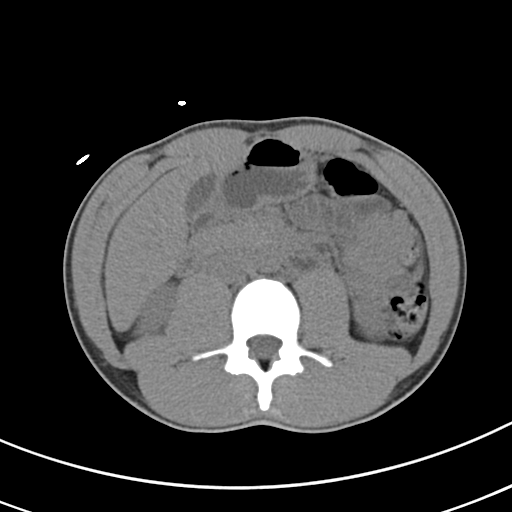
[im 64/85  soft-tissue]
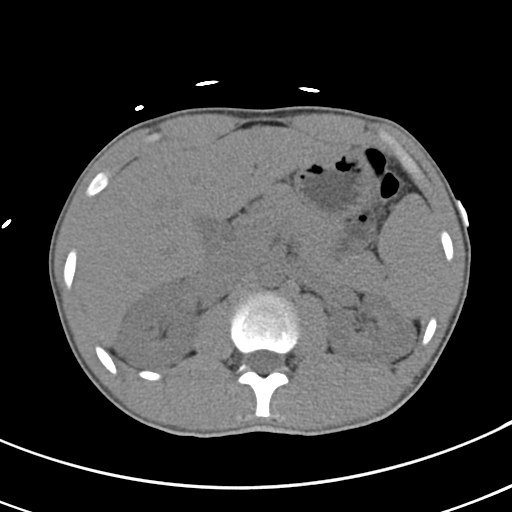
[im 68/85  soft-tissue]
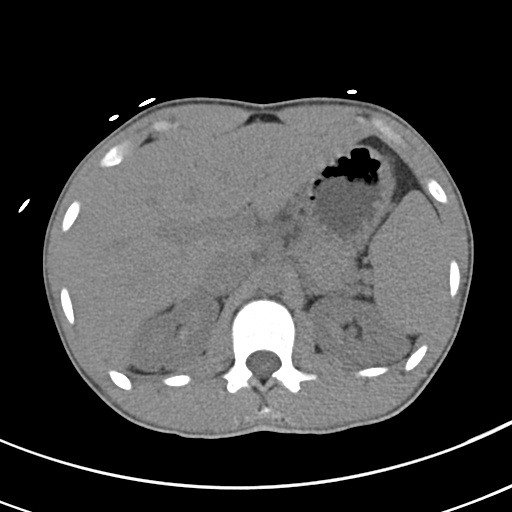
[im 72/85  soft-tissue]
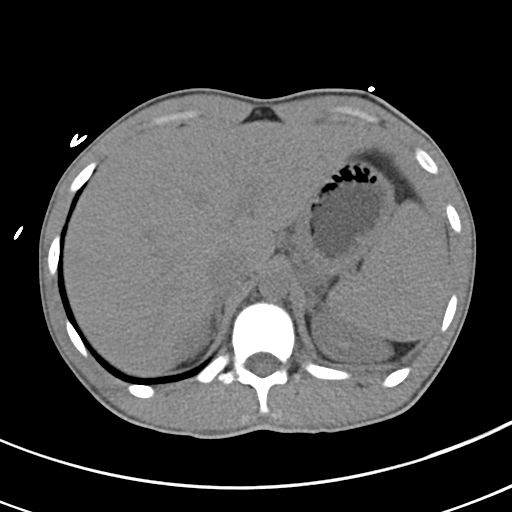
[im 80/85  soft-tissue]
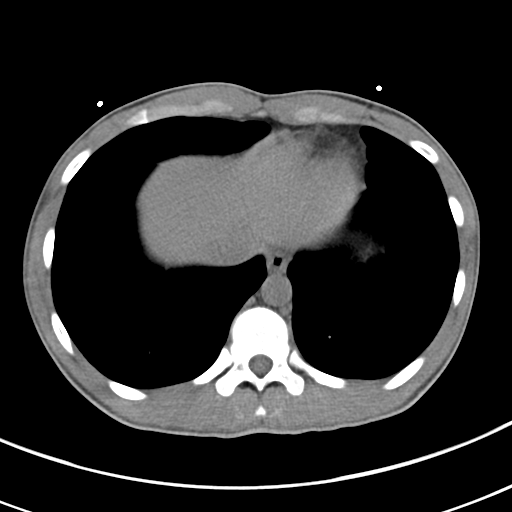

[Series 4: coronal · coronal · 0.90mm/px · 3 of 97 slices shown]
[im 33/97  soft-tissue]
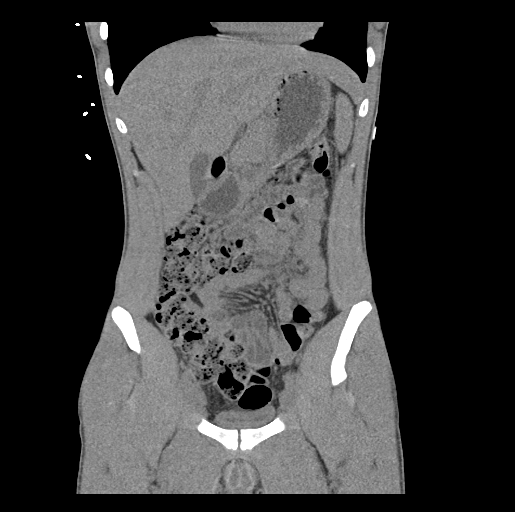
[im 43/97  soft-tissue]
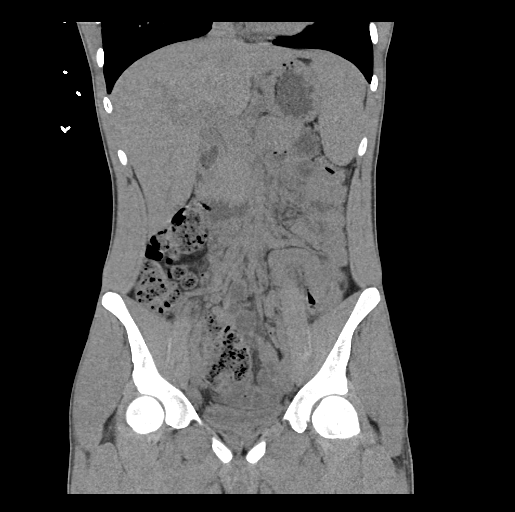
[im 54/97  soft-tissue]
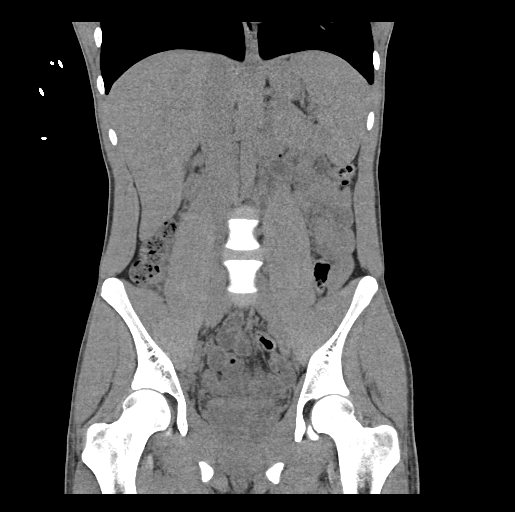

[17 of 46 positions shown; findings below may reference images not displayed]

FINDINGS: Lower chest: Lung bases are clear.

Hepatobiliary: No focal liver lesions are evident on this
noncontrast enhanced study. Gallbladder wall is not appreciably
thickened. There is no appreciable biliary duct dilatation.

Pancreas: No pancreatic mass or inflammatory focus.

Spleen: No splenic lesions are evident.

Adrenals/Urinary Tract: Adrenals bilaterally appear normal. Kidneys
bilaterally show no evident mass or hydronephrosis on either side.
There is no renal or ureteral calculus on either side. Urinary
bladder is midline. Urinary bladder wall is thickened.

Stomach/Bowel: There is no appreciable bowel wall or mesenteric
thickening. No evident bowel obstruction. No free air or portal
venous air.

Vascular/Lymphatic: No abdominal aortic aneurysm. No vascular
lesions are evident on this noncontrast enhanced study. No
adenopathy is appreciable in the abdomen or pelvis.

Reproductive: Prostate and seminal vesicles appear normal in size
and contour. No evident pelvic mass.

Other: Appendix appears normal. There is no abscess or ascites
evident in the abdomen or pelvis.

Musculoskeletal: There are no blastic or lytic bone lesions. No
intramuscular or abdominal wall lesion.
IMPRESSION: 1. Urinary bladder wall thickening, a finding felt to represent a
degree of cystitis.

2.  No renal or ureteral calculus.  No hydronephrosis.

3. Appendix appears normal. No evident bowel obstruction. No abscess
in the abdomen or pelvis.

## 2019-12-15 ENCOUNTER — Other Ambulatory Visit: Payer: Self-pay | Admitting: Allergy

## 2019-12-15 DIAGNOSIS — J4540 Moderate persistent asthma, uncomplicated: Secondary | ICD-10-CM

## 2020-02-09 ENCOUNTER — Encounter: Payer: Self-pay | Admitting: Family Medicine

## 2020-02-09 ENCOUNTER — Other Ambulatory Visit: Payer: Self-pay

## 2020-02-09 ENCOUNTER — Telehealth: Payer: Self-pay | Admitting: Allergy

## 2020-02-09 ENCOUNTER — Ambulatory Visit (INDEPENDENT_AMBULATORY_CARE_PROVIDER_SITE_OTHER): Payer: 59 | Admitting: Family Medicine

## 2020-02-09 DIAGNOSIS — T7800XD Anaphylactic reaction due to unspecified food, subsequent encounter: Secondary | ICD-10-CM | POA: Diagnosis not present

## 2020-02-09 DIAGNOSIS — J4541 Moderate persistent asthma with (acute) exacerbation: Secondary | ICD-10-CM

## 2020-02-09 DIAGNOSIS — Z720 Tobacco use: Secondary | ICD-10-CM | POA: Diagnosis not present

## 2020-02-09 DIAGNOSIS — J3089 Other allergic rhinitis: Secondary | ICD-10-CM

## 2020-02-09 MED ORDER — BUDESONIDE-FORMOTEROL FUMARATE 160-4.5 MCG/ACT IN AERO: 2.0000 | INHALATION_SPRAY | Freq: Two times a day (BID) | RESPIRATORY_TRACT | 5 refills | Status: DC

## 2020-02-09 MED ORDER — PREDNISONE 10 MG PO TABS: ORAL_TABLET | ORAL | 0 refills | Status: DC

## 2020-02-09 NOTE — Telephone Encounter (Signed)
Mom called and said Matthew Pugh was having an asthma flare. Last seen 08/06/19. She said she is a Engineer, civil (consulting) and has been watching his breathing and he is doing ok. She made a televisit appointment with Thurston Hole on Wednesday. She wants to know if something, such as prednisone could be called in before that. She said that works for him. [facility].

## 2020-02-09 NOTE — Progress Notes (Signed)
RE: Matthew Pugh MRN: [id number] DOB: [birth year] Date of Telemedicine Visit: 02/09/2020  Referring provider: Hoyt Koch, * Primary care provider: Hoyt Koch, MD  Chief Complaint: Asthma (Wheezing and SOB for a couple of days. ) and Allergies (Runny Nose)   Telemedicine Follow Up Visit via Telephone: I connected with Matthew Pugh for a follow up on 02/09/20 by telephone and verified that I am speaking with the correct person using two identifiers.   I discussed the limitations, risks, security and privacy concerns of performing an evaluation and management service by telephone and the availability of in person appointments. I also discussed with the patient that there may be a patient responsible charge related to this service. The patient expressed understanding and agreed to proceed.  Patient is at home  Provider is at the office.  Visit start time: 12 Visit end time: [place] consent/check in by: [facility] consent and medical assistant/nurse: Olivia Mackie  History of Present Illness: He is a 24 y.o. male, who is being followed for asthma, allergic rhinitis, tobacco use, and food allergy to chicken, [place], fish, egg, peanut, and tree nuts. His previous allergy office visit was on 08/06/2019 with Dr. Maudie Mercury.  At today's visit, he reports that he began to experience shortness of breath with activity and rest, wheeze, and dry cough that began about 2 days ago.  He is currently using Flovent 110 " probably like twice a day" and using albuterol about twice a day.  He reports that he is not smoking every day but he smokes pretty often.  Allergic rhinitis is reported as not well controlled with nasal congestion is the main symptom for which she is taking Zyrtec and has not been using nasal sprays.  He continues to avoid chicken, [place], fish, egg, peanut, and tree nut.  He has not had any accidental ingestion nor has he needed to use his EpiPen since his last visit to this  clinic.  His current medications are listed in the chart.    Assessment and Plan: [name] is a 24 y.o. male with: Patient Instructions  Asthma Stop Symbicort 80 and begin Symbicort 160-2 puffs twice a day with a spacer to prevent cough or wheeze Begin prednisone taper Continue albuterol 2 puffs once every 4 hours as needed for a cough or wheeze Use albuterol 2 puffs 5-15 minutes before activity to reduce cough or wheeze We will collect some labs to help [place] manage your asthma better. You can get these drawn at any [facility]. We will call you when the results are available  Allergic rhinitis Continue cetirizine 10 mg once a day as needed for a runny nose Continue Flonase 2 sprays in each nostril once a day as needed for a stuffy nose Consider saline nasal rinses as needed for nasal symptoms. Use this before any medicated nasal sprays for best result  Tobacco use Stop or reduce smoking  Food allergy Continue to avoid chicken, [place], fish, egg, peanuts, and tree nuts. In case of an allergic reaction, take Benadryl 50 mg every 4 hours, and if life-threatening symptoms occur, inject with EpiPen 0.3 mg.  Call the clinic if this treatment plan is not working well for you  Follow up in 2 months or sooner if needed.    Return in about 2 months (around 04/10/2020), or if symptoms worsen or fail to improve.  Meds ordered this encounter  Medications  . budesonide-formoterol (SYMBICORT) 160-4.5 MCG/ACT inhaler    Sig: Inhale 2 puffs into  the lungs 2 (two) times daily. Use with spacer.    Dispense:  1 Inhaler    Refill:  5  . predniSONE (DELTASONE) 10 MG tablet    Sig: Take 2 tablets daily for 4 days and 1 tablet on day 5.    Dispense:  9 tablet    Refill:  0    Medication List:  Current Outpatient Medications  Medication Sig Dispense Refill  . albuterol (PROVENTIL) (2.5 MG/3ML) 0.083% nebulizer solution Take 3 mLs (2.5 mg total) by nebulization every 6 (six) hours as needed for wheezing  or shortness of breath. 75 mL 3  . albuterol (VENTOLIN HFA) 108 (90 Base) MCG/ACT inhaler Inhale 1-2 puffs into the lungs every 6 (six) hours as needed for wheezing or shortness of breath. 1 Inhaler 2  . cetirizine (ZYRTEC) 10 MG tablet Take 10 mg by mouth daily as needed for allergies.     Marland Kitchen EPINEPHrine (EPIPEN) 0.3 mg/0.3 mL IJ SOAJ injection Inject 0.3 mLs (0.3 mg total) into the muscle as needed. (Patient taking differently: Inject 0.3 mg into the muscle as needed for anaphylaxis. ) 2 Device 1  . FLOVENT HFA 110 MCG/ACT inhaler INHALE 2 PUFFS INTO THE LUNGS 2 (TWO) TIMES DAILY. RINSE, GARGLE AND SPIT OUT AFTER USE 12 g 0  . budesonide-formoterol (SYMBICORT) 160-4.5 MCG/ACT inhaler Inhale 2 puffs into the lungs 2 (two) times daily. Use with spacer. 1 Inhaler 5  . fluticasone (FLONASE) 50 MCG/ACT nasal spray Place 2 sprays into both nostrils daily. As needed (Patient not taking: Reported on 08/06/2019) 16 g 5  . predniSONE (DELTASONE) 10 MG tablet Take 2 tablets daily for 4 days and 1 tablet on day 5. 9 tablet 0   No current facility-administered medications for this visit.   Allergies: Allergies  Allergen Reactions  . Cephalosporins Anaphylaxis    Nose and throat swelling  . Eggs Or Egg-Derived Products Anaphylaxis  . Peanut-Containing Drug Products Anaphylaxis  . Penicillins Hives, Shortness Of Breath, Itching and Rash  . Poultry Meal Anaphylaxis    All poultry  . Shellfish Allergy Anaphylaxis  . Mylanta [Alum & Mag Hydroxide-Simeth] Other (See Comments)    Nose swelling and cough  . Zithromax [Azithromycin] Rash   I reviewed his past medical history, social history, family history, and environmental history and no significant changes have been reported from previous visit on 08/06/2019.  Objective: Physical Exam Not obtained as encounter was done via telephone.   Previous notes and tests were reviewed.  I discussed the assessment and treatment plan with the patient. The patient  was provided an opportunity to ask questions and all were answered. The patient agreed with the plan and demonstrated an understanding of the instructions.   The patient was advised to call back or seek an in-person evaluation if the symptoms worsen or if the condition fails to improve as anticipated.  I provided 29 minutes of non-face-to-face time during this encounter.  It was my pleasure to participate in [place] care today. Please feel free to contact me with any questions or concerns.   Sincerely,  Thermon Leyland, FNP

## 2020-02-09 NOTE — Telephone Encounter (Signed)
Called and spoke with the patient's mother and she is scheduling a televisit today with [employer].

## 2020-02-09 NOTE — Telephone Encounter (Signed)
Can you please find out specific symptoms and what he is using for an inhaler right now? Thank you

## 2020-02-09 NOTE — Telephone Encounter (Signed)
Anne please advise. Thank you.

## 2020-02-09 NOTE — Patient Instructions (Signed)
Asthma Stop Symbicort 80 and begin Symbicort 160-2 puffs twice a day with a spacer to prevent cough or wheeze Begin prednisone taper Continue albuterol 2 puffs once every 4 hours as needed for a cough or wheeze Use albuterol 2 puffs 5-15 minutes before activity to reduce cough or wheeze We will collect some labs to help [place] manage your asthma better. You can get these drawn at any [facility]. We will call you when the results are available  Allergic rhinitis Continue cetirizine 10 mg once a day as needed for a runny nose Continue Flonase 2 sprays in each nostril once a day as needed for a stuffy nose Consider saline nasal rinses as needed for nasal symptoms. Use this before any medicated nasal sprays for best result  Tobacco use Stop or reduce smoking  Food allergy Continue to avoid chicken, [place], fish, egg, peanuts, and tree nuts. In case of an allergic reaction, take Benadryl 50 mg every 4 hours, and if life-threatening symptoms occur, inject with EpiPen 0.3 mg.  Call the clinic if this treatment plan is not working well for you  Follow up in 2 months or sooner if needed.

## 2020-02-10 ENCOUNTER — Encounter: Payer: Self-pay | Admitting: Internal Medicine

## 2020-02-10 ENCOUNTER — Telehealth: Payer: Self-pay

## 2020-02-10 ENCOUNTER — Telehealth (INDEPENDENT_AMBULATORY_CARE_PROVIDER_SITE_OTHER): Payer: 59 | Admitting: Internal Medicine

## 2020-02-10 DIAGNOSIS — Z720 Tobacco use: Secondary | ICD-10-CM | POA: Diagnosis not present

## 2020-02-10 DIAGNOSIS — J3089 Other allergic rhinitis: Secondary | ICD-10-CM

## 2020-02-10 DIAGNOSIS — J4541 Moderate persistent asthma with (acute) exacerbation: Secondary | ICD-10-CM

## 2020-02-10 MED ORDER — FLUTICASONE PROPIONATE 50 MCG/ACT NA SUSP: 2.0000 | Freq: Every day | NASAL | 5 refills | Status: DC

## 2020-02-10 NOTE — Progress Notes (Signed)
Virtual Visit via Video Note  I connected with Matthew Pugh on 02/10/20 at  2:00 PM EDT by a video enabled telemedicine application and verified that I am speaking with the correct person using two identifiers.  The patient and the provider were at separate locations throughout the entire encounter.   I discussed the limitations of evaluation and management by telemedicine and the availability of in person appointments. The patient expressed understanding and agreed to proceed. The patient and the provider were the only parties present for the visit unless noted in HPI below.  History of Present Illness: The patient is a 24 y.o. man with visit for allergies, congestion. Has concurrent asthma and typically this time of year is rough for him. Started taking otc zyrtec but does not feel that this is doing much. Previously has taken flonase which did more. Had a visit with allergy/asthma specialist recently and they started him on prednisone and given symbicort rx to use temporarily if needed. He started taking those last night and has not seen much benefit yet. Has no fevers or chills. Overall it is not improving.   Observations/Objective: Appearance: normal, some drainage, no wheezing or SOB or cough throughout the visit, breathing appears normal, casual grooming, abdomen does not appear distended, throat redness with clear drainage, memory normal, mental status is A and O times 3  Assessment and Plan: See problem oriented charting  Follow Up Instructions: rx flonase and continue treatment per asthma specialist  I discussed the assessment and treatment plan with the patient. The patient was provided an opportunity to ask questions and all were answered. The patient agreed with the plan and demonstrated an understanding of the instructions.   The patient was advised to call back or seek an in-person evaluation if the symptoms worsen or if the condition fails to improve as anticipated.  Myrlene Broker, MD

## 2020-02-10 NOTE — Telephone Encounter (Signed)
Telehealth consent 

## 2020-02-11 ENCOUNTER — Ambulatory Visit: Payer: 59 | Admitting: Family Medicine

## 2020-02-12 NOTE — Assessment & Plan Note (Signed)
Advised to take prednisone as prescribed. Start flonase and keep zyrtec. Taking symbicort as needed and continue flovent and albuterol prn.

## 2020-02-12 NOTE — Assessment & Plan Note (Signed)
Encouraged to quit given asthma and he will work on it.

## 2020-08-17 ENCOUNTER — Emergency Department (HOSPITAL_COMMUNITY)
Admission: EM | Admit: 2020-08-17 | Discharge: 2020-08-18 | Disposition: A | Discharge status: Home / Self Care | Payer: 59 | Attending: Emergency Medicine | Admitting: Emergency Medicine

## 2020-08-17 ENCOUNTER — Other Ambulatory Visit: Payer: Self-pay

## 2020-08-17 ENCOUNTER — Emergency Department (HOSPITAL_COMMUNITY): Payer: 59

## 2020-08-17 DIAGNOSIS — S300XXA Contusion of lower back and pelvis, initial encounter: Secondary | ICD-10-CM

## 2020-08-17 DIAGNOSIS — Z91010 Allergy to peanuts: Secondary | ICD-10-CM | POA: Insufficient documentation

## 2020-08-17 DIAGNOSIS — M25551 Pain in right hip: Secondary | ICD-10-CM | POA: Insufficient documentation

## 2020-08-17 DIAGNOSIS — J4541 Moderate persistent asthma with (acute) exacerbation: Secondary | ICD-10-CM | POA: Insufficient documentation

## 2020-08-17 DIAGNOSIS — Z7951 Long term (current) use of inhaled steroids: Secondary | ICD-10-CM | POA: Insufficient documentation

## 2020-08-17 DIAGNOSIS — R102 Pelvic and perineal pain: Secondary | ICD-10-CM | POA: Insufficient documentation

## 2020-08-17 DIAGNOSIS — M25552 Pain in left hip: Secondary | ICD-10-CM | POA: Insufficient documentation

## 2020-08-17 MED ORDER — OXYCODONE-ACETAMINOPHEN 5-325 MG PO TABS: 1.0000 | ORAL_TABLET | ORAL | 0 refills | Status: DC | PRN

## 2020-08-17 MED ORDER — OXYCODONE-ACETAMINOPHEN 5-325 MG PO TABS
1.0000 | ORAL_TABLET | Freq: Once | ORAL | Status: AC
  Administered 2020-08-17: 1 via ORAL
  Filled 2020-08-17: qty 1
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
## 2020-08-17 NOTE — Discharge Instructions (Signed)
Rest as much as possible for several days.  Use the pain medicine prescribed but do not drive or work when taking it.  Follow-up with your doctor for checkup if not better in 1 week.
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
## 2020-08-17 NOTE — ED Notes (Signed)
Matthew Pugh [phone] would like a call with updates

## 2020-12-05 ENCOUNTER — Other Ambulatory Visit: Payer: Self-pay | Admitting: Family Medicine

## 2020-12-05 ENCOUNTER — Telehealth: Payer: Self-pay | Admitting: Family Medicine

## 2020-12-05 DIAGNOSIS — J4540 Moderate persistent asthma, uncomplicated: Secondary | ICD-10-CM

## 2020-12-05 MED ORDER — AEROCHAMBER PLUS MISC: 0 refills | Status: AC

## 2020-12-05 MED ORDER — ALBUTEROL SULFATE HFA 108 (90 BASE) MCG/ACT IN AERS: 1.0000 | INHALATION_SPRAY | RESPIRATORY_TRACT | 0 refills | Status: DC | PRN

## 2020-12-05 MED ORDER — PREDNISONE 20 MG PO TABS: 40.0000 mg | ORAL_TABLET | Freq: Every day | ORAL | 0 refills | Status: AC

## 2020-12-05 MED ORDER — EPINEPHRINE 0.3 MG/0.3ML IJ SOAJ: 0.3000 mg | INTRAMUSCULAR | 0 refills | Status: DC | PRN

## 2020-12-05 MED ORDER — BUDESONIDE-FORMOTEROL FUMARATE 160-4.5 MCG/ACT IN AERO: 2.0000 | INHALATION_SPRAY | Freq: Two times a day (BID) | RESPIRATORY_TRACT | 0 refills | Status: DC

## 2020-12-05 NOTE — Telephone Encounter (Signed)
Answering service call:   Matthew Pugh has asthma attack this morning around 6am. Asthma attacks per mom always start the same; excessive nose running, then goes into breathing issues. He started to have chest pain, color didn't look good. Tried symbicort and albuterol without relief, then albuterol neb, still looked worse so mom called EMS. They arrived, treated patient with atrovent/albuterol/O2 and resolved his wheezing. They elected not to go to hospital due to overload with sick/covid patients. He was also given 50mg  benadryl. He is sleeping now, breathing is comfortable.  Mom states no sick sx prior to this attack, but he has been working at and exposed to allergens. He has multiple food/environmental allergies. He is still smoking. He was not regularly using symbicort or other preventative inhaler.   Since they did not go to hospital; they were advised to call on call physician for follow up instructions.   I sent in refills of both albuterol and symbicort for him as well as new spacing chamber. I sent in prednisone burst; advised taking first dose today. Advised at least TID albuterol dosing for at least 24 but consider 48 hours until prednisone can kick in. If requiring albuterol more than q4 needs to be seen.

## 2020-12-06 NOTE — Telephone Encounter (Signed)
[facility] Report/Call : ---Caller states her son had an asthma attack and EMS was called at approx 0600 this AM. Atrovent and Albuterol was given to pt by EMS. He was instructed to get medication called in. He is resting now but he is having shortness of breath. Caller states pt has his albuterol and symbicort inhaler.   Spoke with on call doctor - See TE below.  LVM for patient patient to call back and make an appointment.

## 2020-12-07 NOTE — Telephone Encounter (Signed)
Spoke with the patient's mother and she stated that he is taking Prednisone 40 mg x 5 days. I offered her a follow up appointment for today. They are unable to come into the office today. She stated that she would call us back to schedule a follow up once she has her calendar in front of her. Patient is doing well. Patient's mother was very appreciative for the call.

## 2021-06-07 ENCOUNTER — Ambulatory Visit (INDEPENDENT_AMBULATORY_CARE_PROVIDER_SITE_OTHER): Payer: 59 | Admitting: Internal Medicine

## 2021-06-07 ENCOUNTER — Encounter: Payer: Self-pay | Admitting: Internal Medicine

## 2021-06-07 ENCOUNTER — Other Ambulatory Visit: Payer: Self-pay

## 2021-06-07 VITALS — BP 112/62 | HR 57 | Temp 98.0°F | Resp 18 | Ht 68.0 in | Wt 128.2 lb

## 2021-06-07 DIAGNOSIS — Z0000 Encounter for general adult medical examination without abnormal findings: Secondary | ICD-10-CM | POA: Diagnosis not present

## 2021-06-07 DIAGNOSIS — Z720 Tobacco use: Secondary | ICD-10-CM | POA: Diagnosis not present

## 2021-06-07 DIAGNOSIS — J4540 Moderate persistent asthma, uncomplicated: Secondary | ICD-10-CM | POA: Diagnosis not present

## 2021-06-07 MED ORDER — ALBUTEROL SULFATE HFA 108 (90 BASE) MCG/ACT IN AERS: 1.0000 | INHALATION_SPRAY | RESPIRATORY_TRACT | 0 refills | Status: DC | PRN

## 2021-06-07 MED ORDER — ALBUTEROL SULFATE (2.5 MG/3ML) 0.083% IN NEBU: 2.5000 mg | INHALATION_SOLUTION | Freq: Four times a day (QID) | RESPIRATORY_TRACT | 3 refills | Status: AC | PRN

## 2021-06-07 MED ORDER — BUDESONIDE-FORMOTEROL FUMARATE 160-4.5 MCG/ACT IN AERO: 2.0000 | INHALATION_SPRAY | Freq: Two times a day (BID) | RESPIRATORY_TRACT | 11 refills | Status: DC

## 2021-06-07 MED ORDER — PREDNISONE 20 MG PO TABS: 40.0000 mg | ORAL_TABLET | Freq: Every day | ORAL | 0 refills | Status: DC

## 2021-06-07 MED ORDER — EPINEPHRINE 0.3 MG/0.3ML IJ SOAJ: 0.3000 mg | INTRAMUSCULAR | 0 refills | Status: AC | PRN

## 2021-06-07 NOTE — Patient Instructions (Addendum)
Think about the HPV vaccine.  We have refilled the symbicort and have sent in the prednisone.

## 2021-06-07 NOTE — Progress Notes (Signed)
   Subjective:   Patient ID: Matthew Pugh, male    DOB: 14-Mar-1996, 25 y.o.   MRN: [id number]  HPI The patient is a 25 YO man coming in for physical and with new concerns:  Asthma flare up and not on symbicort for some time due to lack of visit. Using albuterol more and in the night as well as with activity multiple times per week. Mom present and providers the history.   Review of Systems  Constitutional: Negative.   HENT: Negative.    Eyes: Negative.   Respiratory:  Positive for cough and shortness of breath. Negative for chest tightness.   Cardiovascular:  Negative for chest pain, palpitations and leg swelling.  Gastrointestinal:  Negative for abdominal distention, abdominal pain, constipation, diarrhea, nausea and vomiting.  Musculoskeletal: Negative.   Skin: Negative.   Neurological: Negative.   Psychiatric/Behavioral: Negative.     Objective:  Physical Exam Constitutional:      Appearance: He is well-developed.  HENT:     Head: Normocephalic and atraumatic.  Cardiovascular:     Rate and Rhythm: Normal rate and regular rhythm.  Pulmonary:     Effort: Pulmonary effort is normal. No respiratory distress.     Breath sounds: Wheezing present. No rales.  Abdominal:     General: Bowel sounds are normal. There is no distension.     Palpations: Abdomen is soft.     Tenderness: There is no abdominal tenderness. There is no rebound.  Musculoskeletal:     Cervical back: Normal range of motion.  Skin:    General: Skin is warm and dry.  Neurological:     Mental Status: He is alert and oriented to person, place, and time.     Coordination: Coordination normal.    Vitals:   06/07/21 1544  BP: 112/62  Pulse: (!) 57  Resp: 18  Temp: 98 F (36.7 C)  TempSrc: Oral  SpO2: 97%  Weight: 128 lb 3.2 oz (58.2 kg)  Height: 5\' 8"  (1.727 m)    This visit occurred during the SARS-CoV-2 public health emergency.  Safety protocols were in place, including screening questions prior to  the visit, additional usage of staff PPE, and extensive cleaning of exam room while observing appropriate contact time as indicated for disinfecting solutions.   Assessment & Plan:

## 2021-06-08 LAB — LIPID PANEL
Cholesterol: 132 mg/dL (ref 0–200)
HDL: 55.6 mg/dL (ref >39.00)
LDL Cholesterol: 59 mg/dL (ref 0–99)
NonHDL: 76.07
Total CHOL/HDL Ratio: 2
Triglycerides: 87 mg/dL (ref 0.0–149.0)
VLDL: 17.4 mg/dL (ref 0.0–40.0)

## 2021-06-08 LAB — COMPREHENSIVE METABOLIC PANEL
ALT: 20 U/L (ref 0–53)
AST: 22 U/L (ref 0–37)
Albumin: 4.2 g/dL (ref 3.5–5.2)
Alkaline Phosphatase: 49 U/L (ref 39–117)
BUN: 20 mg/dL (ref 6–23)
CO2: 25 mEq/L (ref 19–32)
Calcium: 9.4 mg/dL (ref 8.4–10.5)
Chloride: 107 mEq/L (ref 96–112)
Creatinine, Ser: 1.19 mg/dL (ref 0.40–1.50)
GFR: 85.67 mL/min (ref >60.00)
Glucose, Bld: 91 mg/dL (ref 70–99)
Potassium: 4 mEq/L (ref 3.5–5.1)
Sodium: 139 mEq/L (ref 135–145)
Total Bilirubin: 0.4 mg/dL (ref 0.2–1.2)
Total Protein: 6.4 g/dL (ref 6.0–8.3)

## 2021-06-08 LAB — CBC
HCT: 41.2 % (ref 39.0–52.0)
Hemoglobin: 13.8 g/dL (ref 13.0–17.0)
MCHC: 33.4 g/dL (ref 30.0–36.0)
MCV: 90.3 fl (ref 78.0–100.0)
Platelets: 254 10*3/uL (ref 150.0–400.0)
RBC: 4.57 Mil/uL (ref 4.22–5.81)
RDW: 12.9 % (ref 11.5–15.5)
WBC: 4.4 10*3/uL (ref 4.0–10.5)

## 2021-06-10 NOTE — Assessment & Plan Note (Signed)
Flu shot counseled yearly. Covid-19 counseled. HPV offered declines. Tetanus declines. Counseled about sun safety and mole surveillance. Counseled about the dangers of distracted driving. Given 10 year screening recommendations.

## 2021-06-10 NOTE — Assessment & Plan Note (Signed)
With flare refill symbicort and rx prednisone for the flare. With wheezing on exam. Advised to stop smoking and make sure to have regular care.

## 2021-06-10 NOTE — Assessment & Plan Note (Signed)
Advised to stop smoking. He has asthma with current flare. He is not willing to make attempt.

## 2021-08-05 ENCOUNTER — Other Ambulatory Visit: Payer: Self-pay | Admitting: Internal Medicine

## 2021-08-05 DIAGNOSIS — J4540 Moderate persistent asthma, uncomplicated: Secondary | ICD-10-CM
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
## 2022-10-01 ENCOUNTER — Other Ambulatory Visit: Payer: Self-pay | Admitting: Internal Medicine

## 2022-10-03 ENCOUNTER — Other Ambulatory Visit: Payer: Self-pay | Admitting: Internal Medicine

## 2022-10-03 DIAGNOSIS — J4540 Moderate persistent asthma, uncomplicated: Secondary | ICD-10-CM

## 2022-10-04 NOTE — Telephone Encounter (Signed)
Spoke with patients mom and informed him that he will need to schedule a visit or mychart visit in order to get his inhaler refilled

## 2022-11-12 ENCOUNTER — Other Ambulatory Visit: Payer: Self-pay | Admitting: Internal Medicine

## 2022-11-12 DIAGNOSIS — J4540 Moderate persistent asthma, uncomplicated: Secondary | ICD-10-CM

## 2022-11-28 ENCOUNTER — Encounter: Payer: 59 | Admitting: Internal Medicine

## 2022-12-06 ENCOUNTER — Encounter: Payer: Self-pay | Admitting: Internal Medicine

## 2022-12-06 ENCOUNTER — Ambulatory Visit (INDEPENDENT_AMBULATORY_CARE_PROVIDER_SITE_OTHER): Payer: 59 | Admitting: Internal Medicine

## 2022-12-06 VITALS — BP 80/60 | HR 67 | Temp 98.2°F | Ht 68.0 in | Wt 135.0 lb

## 2022-12-06 DIAGNOSIS — J3089 Other allergic rhinitis: Secondary | ICD-10-CM | POA: Diagnosis not present

## 2022-12-06 DIAGNOSIS — J4540 Moderate persistent asthma, uncomplicated: Secondary | ICD-10-CM | POA: Diagnosis not present

## 2022-12-06 DIAGNOSIS — Z0000 Encounter for general adult medical examination without abnormal findings: Secondary | ICD-10-CM | POA: Diagnosis not present

## 2022-12-06 DIAGNOSIS — Z720 Tobacco use: Secondary | ICD-10-CM | POA: Diagnosis not present

## 2022-12-06 MED ORDER — FLUTICASONE PROPIONATE 50 MCG/ACT NA SUSP: 2.0000 | Freq: Every day | NASAL | 5 refills | Status: AC

## 2022-12-06 MED ORDER — BUDESONIDE-FORMOTEROL FUMARATE 160-4.5 MCG/ACT IN AERO: INHALATION_SPRAY | RESPIRATORY_TRACT | 11 refills | Status: AC

## 2022-12-06 MED ORDER — ALBUTEROL SULFATE HFA 108 (90 BASE) MCG/ACT IN AERS: INHALATION_SPRAY | RESPIRATORY_TRACT | 2 refills | Status: DC

## 2022-12-06 NOTE — Assessment & Plan Note (Signed)
States not smoking currently and reminded that smoking is especially bad for his asthma and he should remain smoke free for life.

## 2022-12-06 NOTE — Assessment & Plan Note (Signed)
Uses flonase seasonally and prn. Refilled today.

## 2022-12-06 NOTE — Assessment & Plan Note (Signed)
Flu shot declines. Covid-19 counseled. Tetanus declines. Counseled about sun safety and mole surveillance. Counseled about the dangers of distracted driving. Given 10 year screening recommendations.   

## 2022-12-06 NOTE — Assessment & Plan Note (Signed)
Mild intermittent and no flare today. Using albuterol prn and refilled.

## 2022-12-06 NOTE — Progress Notes (Signed)
   Subjective:   Patient ID: Matthew Pugh, male    DOB: [birth year], 27 y.o.   MRN: [id number]  HPI The patient is here for physical.  PMH, [facility], social history reviewed and updated  Review of Systems  Constitutional: Negative.   HENT: Negative.    Eyes: Negative.   Respiratory:  Negative for cough, chest tightness and shortness of breath.   Cardiovascular:  Negative for chest pain, palpitations and leg swelling.  Gastrointestinal:  Negative for abdominal distention, abdominal pain, constipation, diarrhea, nausea and vomiting.  Musculoskeletal: Negative.   Skin: Negative.   Neurological: Negative.   Psychiatric/Behavioral: Negative.      Objective:  Physical Exam Constitutional:      Appearance: He is well-developed.  HENT:     Head: Normocephalic and atraumatic.  Cardiovascular:     Rate and Rhythm: Normal rate and regular rhythm.  Pulmonary:     Effort: Pulmonary effort is normal. No respiratory distress.     Breath sounds: Normal breath sounds. No wheezing or rales.  Abdominal:     General: Bowel sounds are normal. There is no distension.     Palpations: Abdomen is soft.     Tenderness: There is no abdominal tenderness. There is no rebound.  Musculoskeletal:     Cervical back: Normal range of motion.  Skin:    General: Skin is warm and dry.  Neurological:     Mental Status: He is alert and oriented to person, place, and time.     Coordination: Coordination normal.     Vitals:   12/06/22 0920  BP: (!) 80/60  Pulse: 67  Temp: 98.2 F (36.8 C)  TempSrc: Oral  SpO2: 97%  Weight: 135 lb (61.2 kg)  Height: 5\' 8"  (1.727 m)    Assessment & Plan:

## 2023-04-21 ENCOUNTER — Other Ambulatory Visit: Payer: Self-pay | Admitting: Internal Medicine

## 2023-04-21 DIAGNOSIS — J4540 Moderate persistent asthma, uncomplicated: Secondary | ICD-10-CM

## 2023-08-02 ENCOUNTER — Emergency Department (HOSPITAL_COMMUNITY)
Admission: EM | Admit: 2023-08-02 | Discharge: 2023-08-02 | Disposition: A | Discharge status: Home / Self Care | Payer: Self-pay | Attending: Emergency Medicine | Admitting: Emergency Medicine

## 2023-08-02 DIAGNOSIS — R000 Tachycardia, unspecified: Secondary | ICD-10-CM | POA: Insufficient documentation

## 2023-08-02 DIAGNOSIS — T7840XA Allergy, unspecified, initial encounter: Secondary | ICD-10-CM

## 2023-08-02 DIAGNOSIS — L500 Allergic urticaria: Secondary | ICD-10-CM | POA: Insufficient documentation

## 2023-08-02 DIAGNOSIS — Z91010 Allergy to peanuts: Secondary | ICD-10-CM | POA: Insufficient documentation

## 2023-08-02 MED ORDER — SODIUM CHLORIDE 0.9 % IV BOLUS
1000.0000 mL | Freq: Once | INTRAVENOUS | Status: AC
  Administered 2023-08-02: 1000 mL via INTRAVENOUS

## 2023-08-02 MED ORDER — METHYLPREDNISOLONE SODIUM SUCC 125 MG IJ SOLR
125.0000 mg | Freq: Once | INTRAMUSCULAR | Status: AC
  Administered 2023-08-02: 125 mg via INTRAVENOUS

## 2023-08-02 MED ORDER — FAMOTIDINE IN NACL 20-0.9 MG/50ML-% IV SOLN
20.0000 mg | Freq: Once | INTRAVENOUS | Status: AC
  Administered 2023-08-02: 20 mg via INTRAVENOUS

## 2023-08-02 MED ORDER — DIPHENHYDRAMINE HCL 50 MG/ML IJ SOLN: 25.0000 mg | Freq: Once | INTRAMUSCULAR | Status: DC

## 2023-08-02 MED ORDER — DIPHENHYDRAMINE HCL 50 MG/ML IJ SOLN
50.0000 mg | Freq: Once | INTRAMUSCULAR | Status: AC
  Administered 2023-08-02: 50 mg via INTRAVENOUS

## 2023-08-02 NOTE — ED Provider Notes (Signed)
[place] EMERGENCY DEPARTMENT AT [facility] Provider Note   CSN: [id number] Arrival date & time: 08/02/23  4098     History  Chief Complaint  Patient presents with   Allergic Reaction    Matthew Pugh is a 27 y.o. male.  27 year old male presents to the emergency department for evaluation of allergic reaction.  He was eating a [place] burger approximately 1 hour ago when he began to have scratchiness in his throat as well as itching to his face and neck.  Has not had any difficulty swallowing, shortness of breath, nausea, vomiting, abdominal pain, syncope or near syncope.  He has a known allergy to [place]; thought that he was eating a beef burger tonight.  No medications administered prior to arrival.  The history is provided by the patient. No language interpreter was used.  Allergic Reaction      Home Medications Prior to Admission medications   Medication Sig Start Date End Date Taking? Authorizing Provider  albuterol (PROVENTIL) (2.5 MG/3ML) 0.083% nebulizer solution Take 3 mLs (2.5 mg total) by nebulization every 6 (six) hours as needed for wheezing or shortness of breath. 06/07/21   Myrlene Broker, MD  albuterol (VENTOLIN HFA) 108 (90 Base) MCG/ACT inhaler INHALE 1-2 PUFFS INTO THE LUNGS EVERY 4 HOURS AS NEEDED FOR WHEEZING OR SHORTNESS OF BREATH. 04/23/23   Myrlene Broker, MD  budesonide-formoterol (SYMBICORT) 160-4.5 MCG/ACT inhaler INHALE 2 PUFFS INTO THE LUNGS 2 TIMES DAILY. USE WITH SPACER. 12/06/22   Myrlene Broker, MD  cetirizine (ZYRTEC) 10 MG tablet Take 10 mg by mouth daily as needed for allergies.     [provider]  EPINEPHrine (EPIPEN) 0.3 mg/0.3 mL IJ SOAJ injection Inject 0.3 mg into the muscle as needed for anaphylaxis. 06/07/21   Myrlene Broker, MD  fluticasone Aleda Grana) 50 MCG/ACT nasal spray Place 2 sprays into both nostrils daily. As needed 12/06/22   Myrlene Broker, MD  Spacer/Aero-Holding Chambers  (AEROCHAMBER PLUS) inhaler Use as instructed 12/05/20   Wynn Banker, MD      Allergies    Cephalosporins, Egg-derived products, Peanut-containing drug products, Penicillins, Poultry meal, Shellfish allergy, Mylanta [alum & mag hydroxide-simeth], and Zithromax [azithromycin]    Review of Systems   Review of Systems Ten systems reviewed and are negative for acute change, except as noted in the HPI.    Physical Exam Updated Vital Signs BP (!) 134/98   Pulse 71   Temp 98 F (36.7 C) (Oral)   Resp 20   SpO2 98%   Physical Exam Vitals and nursing note reviewed.  Constitutional:      General: He is not in acute distress.    Appearance: He is well-developed. He is not diaphoretic.  HENT:     Head: Normocephalic and atraumatic.     Mouth/Throat:     Comments: No oral angioedema.  Tolerating secretions without difficulty.  Normal phonation. Eyes:     General: No scleral icterus.    Conjunctiva/sclera: Conjunctivae normal.  Cardiovascular:     Rate and Rhythm: Regular rhythm. Tachycardia present.     Pulses: Normal pulses.  Pulmonary:     Effort: Pulmonary effort is normal. No respiratory distress.     Breath sounds: No stridor. No wheezing.     Comments: Respirations even and unlabored.  Lungs clear to auscultation bilaterally. Musculoskeletal:        General: Normal range of motion.     Cervical back: Normal range of motion.  Skin:    General: Skin is warm and dry.     Coloration: Skin is not pale.     Findings: No erythema or rash.     Comments: Mild urticarial rash noted to face and neck.  This is primarily concentrated around the eyes and mouth with respect to the face.  Neurological:     Mental Status: He is alert and oriented to person, place, and time.     Coordination: Coordination normal.  Psychiatric:        Behavior: Behavior normal.     ED Results / Procedures / Treatments   Labs (all labs ordered are listed, but only abnormal results are  displayed) Labs Reviewed - No data to display  EKG EKG Interpretation Date/Time:  Thursday August 02 2023 02:48:36 EDT Ventricular Rate:  110 PR Interval:  116 QRS Duration:  83 QT Interval:  338 QTC Calculation: 458 R Axis:   26  Text Interpretation: Sinus tachycardia Confirmed by Gilda Crease [phone]) on 08/02/2023 3:37:00 AM  Radiology No results found.  Procedures Procedures    Medications Ordered in ED Medications  famotidine (PEPCID) IVPB 20 mg premix (0 mg Intravenous Stopped 08/02/23 0400)  methylPREDNISolone sodium succinate (SOLU-MEDROL) 125 mg/2 mL injection 125 mg (125 mg Intravenous Given 08/02/23 0255)  diphenhydrAMINE (BENADRYL) injection 50 mg (50 mg Intravenous Given 08/02/23 0256)  sodium chloride 0.9 % bolus 1,000 mL (1,000 mLs Intravenous New Bag/Given 08/02/23 0301)    ED Course/ Medical Decision Making/ A&P Clinical Course as of 08/02/23 0410  Thu Aug 02, 2023  0355 HR in the 70's. Rash has resolved.  [KH]    Clinical Course User Index [KH] Antony Madura, PA-C                                 Medical Decision Making Risk Prescription drug management.   This patient presents to the ED for concern of allergic reaction, this involves an extensive number of treatment options, and is a complaint that carries with it a high risk of complications and morbidity.  The differential diagnosis includes urticaria vs anaphylaxis   Co morbidities that complicate the patient evaluation  Seasonal allergies Food allergies   Additional history obtained:  External records from outside source obtained and reviewed including primary care d/u in August 2022 for asthma f/u   Cardiac Monitoring:  The patient was maintained on a cardiac monitor.  I personally viewed and interpreted the cardiac monitored which showed an underlying rhythm of: sinus tachycardia > NSR   Medicines ordered and prescription drug management:  I ordered medication including  Benadryl, Pepcid, Solu-Medrol for allergic reaction  Reevaluation of the patient after these medicines showed that the patient improved I have reviewed the patients home medicines and have made adjustments as needed   Test Considered:  EpiPen use   Problem List / ED Course:  27 year old male presenting for allergic reaction to [place].  He has a known allergy to this.  Thought he was eating a beef burger prior to onset of his reaction.  Presenting with scratchy throat, urticaria.  No shortness of breath, inability to swallow, drooling, wheezing, nausea or vomiting.  Symptoms have resolved with Solu-Medrol, Benadryl, Pepcid.  Patient observed in the emergency department for over 1.5 hours.  Upon recheck, he states that he is feeling back to baseline.  Vital signs have normalized.  He has an EpiPen at home to use should  symptoms recur or worsen.  Stable for discharge at this time.   Reevaluation:  After the interventions noted above, I reevaluated the patient and found that they have :improved   Social Determinants of Health:  Good social support; partner at bedside   Dispostion:  After consideration of the diagnostic results and the patients response to treatment, I feel that the patent would benefit from avoidance of allergens. May use Zyrtec or Claritin daily PRN. Return precautions discussed and provided. Patient discharged in stable condition with no unaddressed concerns.         Final Clinical Impression(s) / ED Diagnoses Final diagnoses:  Allergic reaction, initial encounter    Rx / DC Orders ED Discharge Orders     None         Antony Madura, PA-C 08/02/23 0415    Gilda Crease, MD 08/02/23 [phone]

## 2023-08-02 NOTE — Discharge Instructions (Signed)
Continue daily Zyrtec or Claritin as needed as well as Benadryl for persistent allergic reaction.  If you have difficulty swallowing, trouble breathing, nausea, vomiting with a rash, utilize an EpiPen and dial 911.  Follow-up with your primary care doctor.

## 2023-08-02 NOTE — ED Triage Notes (Signed)
Pt states he ate [place] about an hour ago and he is allergic to it, he felt some scratchiness in his throat when eating it and not is has progressed to all over itching, he has some redness/swelling around his mouth. Pt states he does feel like his throat is swelling at this time. Has not taken meds or used epi prior to arrival

## 2023-11-23 ENCOUNTER — Ambulatory Visit: Payer: Self-pay | Admitting: Internal Medicine

## 2023-11-23 NOTE — Telephone Encounter (Signed)
Copied from CRM [id number]. Topic: Clinical - Red Word Triage >> Nov 23, 2023  1:26 PM Pascal Lux wrote: Red Word that prompted transfer to Nurse Triage: Having pain in his mouth, swollen gums, thinks may have an infection.  Chief Complaint: mouth pain Symptoms: pain and swelling, redness top and bottom Frequency: constant Pertinent Negatives: Patient denies difficulty breathing, difficulty swallowing Disposition: [] ED /[x] Urgent Care (no appt availability in office) / [] Appointment(In office/virtual)/ []  [place] [facility]/ [] Home Care/ [] Refused Recommended Disposition /[] [place] [facility]/ []  Follow-up with PCP Additional Notes: Mother called for son.  States she wants him to be seen today for mouth pain now that he has health insurance.  Mother attempted to schedule a virtual, this RN told her virtual visit is not appropriate.  Instructed to take son to UC or to ER.  Reports mouth is red, swollen and painful.  Cannot get into see dentist until next week.  Reason for Disposition  [1] Face is swollen AND [2] no fever  Answer Assessment - Initial Assessment Questions 1. ONSET: "When did the mouth start hurting?" (e.g., hours or days ago)      Started this week, about 5 days 2. SEVERITY: "How bad is the pain?" (Scale 1-10; mild, moderate or severe)   - MILD (1-3):  doesn't interfere with eating or normal activities   - MODERATE (4-7): interferes with eating some solids and normal activities   - SEVERE (8-10):  excruciating pain, interferes with most normal activities   - SEVERE DYSPHAGIA: can't swallow liquids, drooling     7-8/10 3. SORES: "Are there any sores or ulcers in the mouth?" If Yes, ask: "What part of the mouth are the sores in?"     His left top is very red with white area. 4. FEVER: "Do you have a fever?" If Yes, ask: "What is your temperature, how was it measured, and when did it start?"     Denies  5. CAUSE: "What do you think is causing the mouth pain?"      infection 6. OTHER SYMPTOMS: "Do you have any other symptoms?" (e.g., difficulty breathing)     denies  Protocols used: Mouth Pain-A-AH

## 2024-02-29 ENCOUNTER — Other Ambulatory Visit: Payer: Self-pay | Admitting: Internal Medicine

## 2024-04-28 ENCOUNTER — Encounter: Admitting: Internal Medicine

## 2024-06-04 ENCOUNTER — Other Ambulatory Visit: Payer: Self-pay | Admitting: Internal Medicine

## 2024-06-04 DIAGNOSIS — J4540 Moderate persistent asthma, uncomplicated: Secondary | ICD-10-CM
